# Patient Record
Sex: Male | Born: 1980 | Race: White | Hispanic: No | Marital: Married | Smoking: Never smoker
Health system: Southern US, Community
[De-identification: ages and names within clinical notes are randomized; demographics above are authoritative.]

## PROBLEM LIST (undated history)

## (undated) DIAGNOSIS — K5909 Other constipation: Secondary | ICD-10-CM

## (undated) DIAGNOSIS — D497 Neoplasm of unspecified behavior of endocrine glands and other parts of nervous system: Secondary | ICD-10-CM

## (undated) DIAGNOSIS — Z789 Other specified health status: Secondary | ICD-10-CM

## (undated) DIAGNOSIS — K5900 Constipation, unspecified: Secondary | ICD-10-CM

## (undated) DIAGNOSIS — K219 Gastro-esophageal reflux disease without esophagitis: Secondary | ICD-10-CM

## (undated) DIAGNOSIS — Z86018 Personal history of other benign neoplasm: Secondary | ICD-10-CM

## (undated) DIAGNOSIS — Z87898 Personal history of other specified conditions: Secondary | ICD-10-CM

## (undated) DIAGNOSIS — R51 Headache: Secondary | ICD-10-CM

## (undated) DIAGNOSIS — N319 Neuromuscular dysfunction of bladder, unspecified: Secondary | ICD-10-CM

## (undated) HISTORY — PX: INGUINAL HERNIA REPAIR: SUR1180

## (undated) HISTORY — DX: Constipation, unspecified: K59.00

## (undated) HISTORY — PX: HERNIA REPAIR: SHX51

---

## 2012-04-06 ENCOUNTER — Ambulatory Visit (HOSPITAL_COMMUNITY)
Admission: RE | Admit: 2012-04-06 | Discharge: 2012-04-06 | Disposition: A | Discharge status: Home / Self Care | Payer: BC Managed Care – PPO | Source: Ambulatory Visit | Attending: Physical Medicine and Rehabilitation | Admitting: Physical Medicine and Rehabilitation

## 2012-04-06 ENCOUNTER — Other Ambulatory Visit (HOSPITAL_COMMUNITY): Payer: Self-pay | Admitting: Physical Medicine and Rehabilitation

## 2012-04-06 ENCOUNTER — Other Ambulatory Visit: Payer: Self-pay | Admitting: Physical Medicine and Rehabilitation

## 2012-04-06 ENCOUNTER — Ambulatory Visit
Admission: RE | Admit: 2012-04-06 | Discharge: 2012-04-06 | Disposition: A | Discharge status: Home / Self Care | Payer: BC Managed Care – PPO | Source: Ambulatory Visit | Attending: Physical Medicine and Rehabilitation | Admitting: Physical Medicine and Rehabilitation

## 2012-04-06 ENCOUNTER — Inpatient Hospital Stay
Admission: RE | Admit: 2012-04-06 | Discharge: 2012-04-06 | Disposition: A | Discharge status: Home / Self Care | Payer: Self-pay | Source: Ambulatory Visit | Attending: Physical Medicine and Rehabilitation | Admitting: Physical Medicine and Rehabilitation

## 2012-04-06 VITALS — BP 120/78 | HR 64 | Ht 74.0 in | Wt 230.0 lb

## 2012-04-06 DIAGNOSIS — M549 Dorsalgia, unspecified: Secondary | ICD-10-CM

## 2012-04-06 DIAGNOSIS — T7589XD Other specified effects of external causes, subsequent encounter: Secondary | ICD-10-CM

## 2012-04-06 DIAGNOSIS — R209 Unspecified disturbances of skin sensation: Secondary | ICD-10-CM | POA: Insufficient documentation

## 2012-04-06 DIAGNOSIS — R29898 Other symptoms and signs involving the musculoskeletal system: Secondary | ICD-10-CM | POA: Insufficient documentation

## 2012-04-06 DIAGNOSIS — G9389 Other specified disorders of brain: Secondary | ICD-10-CM

## 2012-04-06 DIAGNOSIS — Z1389 Encounter for screening for other disorder: Secondary | ICD-10-CM | POA: Insufficient documentation

## 2012-04-06 DIAGNOSIS — R269 Unspecified abnormalities of gait and mobility: Secondary | ICD-10-CM | POA: Insufficient documentation

## 2012-04-06 DIAGNOSIS — J3489 Other specified disorders of nose and nasal sinuses: Secondary | ICD-10-CM | POA: Insufficient documentation

## 2012-04-06 DIAGNOSIS — M545 Low back pain, unspecified: Secondary | ICD-10-CM | POA: Insufficient documentation

## 2012-04-06 DIAGNOSIS — G939 Disorder of brain, unspecified: Secondary | ICD-10-CM | POA: Insufficient documentation

## 2012-04-06 MED ORDER — DIAZEPAM 5 MG PO TABS
10.0000 mg | ORAL_TABLET | Freq: Once | ORAL | Status: AC
  Administered 2012-04-06: 10 mg via ORAL

## 2012-04-06 MED ORDER — GADOBENATE DIMEGLUMINE 529 MG/ML IV SOLN
20.0000 mL | Freq: Once | INTRAVENOUS | Status: AC | PRN
  Administered 2012-04-06: 20 mL via INTRAVENOUS

## 2012-04-06 MED ORDER — IOHEXOL 180 MG/ML  SOLN
15.0000 mL | Freq: Once | INTRAMUSCULAR | Status: AC | PRN
  Administered 2012-04-06: 15 mL via INTRATHECAL

## 2012-04-06 MED ORDER — DIAZEPAM 5 MG PO TABS: 10.0000 mg | ORAL_TABLET | Freq: Once | ORAL | Status: DC

## 2012-04-06 NOTE — Progress Notes (Signed)
Dr. Alfredo Batty in to talk to pt and wife multiple times due to finding on myelo.  Mother in law and sister in law arrived and Dr. Alfredo Batty explained the findings to them. Plain films done of orbits and pelvis and MRI of head,cervical, thoracic and lumbar are being scheduled in the near future. Dr. Alfredo Batty explained at length why this was necessary and the family asked questions and received answers.

## 2012-04-06 NOTE — Progress Notes (Signed)
Procedure and discharge instructions explained to patient and his wife.  Questions answered.  jkl

## 2012-04-08 ENCOUNTER — Ambulatory Visit (HOSPITAL_COMMUNITY)
Admission: RE | Admit: 2012-04-08 | Discharge: 2012-04-08 | Disposition: A | Discharge status: Home / Self Care | Payer: BC Managed Care – PPO | Source: Ambulatory Visit | Attending: Physical Medicine and Rehabilitation | Admitting: Physical Medicine and Rehabilitation

## 2012-04-08 ENCOUNTER — Ambulatory Visit (HOSPITAL_COMMUNITY): Payer: BC Managed Care – PPO

## 2012-04-08 DIAGNOSIS — D492 Neoplasm of unspecified behavior of bone, soft tissue, and skin: Secondary | ICD-10-CM | POA: Insufficient documentation

## 2012-04-08 DIAGNOSIS — IMO0002 Reserved for concepts with insufficient information to code with codable children: Secondary | ICD-10-CM | POA: Insufficient documentation

## 2012-04-08 DIAGNOSIS — M899 Disorder of bone, unspecified: Secondary | ICD-10-CM | POA: Insufficient documentation

## 2012-04-08 DIAGNOSIS — G9389 Other specified disorders of brain: Secondary | ICD-10-CM

## 2012-04-08 DIAGNOSIS — M48061 Spinal stenosis, lumbar region without neurogenic claudication: Secondary | ICD-10-CM | POA: Insufficient documentation

## 2012-04-08 DIAGNOSIS — M5144 Schmorl's nodes, thoracic region: Secondary | ICD-10-CM | POA: Insufficient documentation

## 2012-04-08 DIAGNOSIS — M549 Dorsalgia, unspecified: Secondary | ICD-10-CM | POA: Insufficient documentation

## 2012-04-08 MED ORDER — GADOBENATE DIMEGLUMINE 529 MG/ML IV SOLN
20.0000 mL | Freq: Once | INTRAVENOUS | Status: AC | PRN
  Administered 2012-04-08: 20 mL via INTRAVENOUS

## 2012-04-11 ENCOUNTER — Other Ambulatory Visit: Payer: Self-pay | Admitting: Neurosurgery

## 2012-04-12 ENCOUNTER — Encounter (HOSPITAL_COMMUNITY): Payer: Self-pay | Admitting: Pharmacy Technician

## 2012-04-14 ENCOUNTER — Encounter (HOSPITAL_COMMUNITY)
Admission: RE | Admit: 2012-04-14 | Discharge: 2012-04-14 | Disposition: A | Discharge status: Home / Self Care | Payer: BC Managed Care – PPO | Source: Ambulatory Visit | Attending: Neurosurgery | Admitting: Neurosurgery

## 2012-04-14 ENCOUNTER — Encounter (HOSPITAL_COMMUNITY): Payer: Self-pay

## 2012-04-14 DIAGNOSIS — Z01812 Encounter for preprocedural laboratory examination: Secondary | ICD-10-CM | POA: Insufficient documentation

## 2012-04-14 DIAGNOSIS — D492 Neoplasm of unspecified behavior of bone, soft tissue, and skin: Secondary | ICD-10-CM | POA: Insufficient documentation

## 2012-04-14 HISTORY — DX: Headache: R51

## 2012-04-14 LAB — BASIC METABOLIC PANEL
BUN: 9 mg/dL (ref 6–23)
CO2: 29 mEq/L (ref 19–32)
Calcium: 9.8 mg/dL (ref 8.4–10.5)
Chloride: 100 mEq/L (ref 96–112)
Creatinine, Ser: 0.91 mg/dL (ref 0.50–1.35)
GFR calc Af Amer: >90 mL/min (ref >90)
GFR calc non Af Amer: >90 mL/min (ref >90)
Glucose, Bld: 79 mg/dL (ref 70–99)
Potassium: 4.6 mEq/L (ref 3.5–5.1)
Sodium: 138 mEq/L (ref 135–145)

## 2012-04-14 LAB — CBC
HCT: 43.4 % (ref 39.0–52.0)
Hemoglobin: 15.6 g/dL (ref 13.0–17.0)
MCH: 30.8 pg (ref 26.0–34.0)
MCHC: 35.9 g/dL (ref 30.0–36.0)
MCV: 85.6 fL (ref 78.0–100.0)
Platelets: 282 10*3/uL (ref 150–400)
RBC: 5.07 MIL/uL (ref 4.22–5.81)
RDW: 12.5 % (ref 11.5–15.5)
WBC: 9.1 10*3/uL (ref 4.0–10.5)

## 2012-04-14 LAB — TYPE AND SCREEN
ABO/RH(D): O NEG
Antibody Screen: NEGATIVE

## 2012-04-14 LAB — ABO/RH: ABO/RH(D): O NEG

## 2012-04-14 LAB — SURGICAL PCR SCREEN
MRSA, PCR: NEGATIVE
Staphylococcus aureus: NEGATIVE

## 2012-04-14 NOTE — Pre-Procedure Instructions (Signed)
GOTTLIEB ZUERCHER  04/14/2012   Your procedure is scheduled on:  Wednesday April 116, 2014  Report to [facility] at 0830 AM.  Call this number if you have problems the morning of surgery: [phone]   Remember:   Do not eat food or drink liquids after midnight.Tuesday   Take these medicines the morning of surgery with A SIP OF WATER: None   Do not wear jewelry.  Do not wear lotions, or powders. You may wear deodorant.             Men may shave face and neck.  Do not bring valuables to the hospital.  Contacts, dentures or bridgework may not be worn into surgery.  Leave suitcase in the car. After surgery it may be brought to your room.  For patients admitted to the hospital, checkout time is 11:00 AM the day of  discharge.   Patients discharged the day of surgery will not be allowed to drive  home.    Special Instructions: Shower using CHG 2 nights before surgery and the night before surgery.  If you shower the day of surgery use CHG.  Use special wash - you have one bottle of CHG for all showers.  You should use approximately 1/3 of the bottle for each shower.   Please read over the following fact sheets that you were given: Pain Booklet, Coughing and Deep Breathing, Blood Transfusion Information, MRSA Information and Surgical Site Infection Prevention

## 2012-04-18 MED ORDER — CEFAZOLIN SODIUM-DEXTROSE 2-3 GM-% IV SOLR
2.0000 g | INTRAVENOUS | Status: AC
  Administered 2012-04-19: 2 g via INTRAVENOUS
  Filled 2012-04-18: qty 50

## 2012-04-19 ENCOUNTER — Encounter (HOSPITAL_COMMUNITY)
Admission: RE | Disposition: A | Discharge status: Home / Self Care | Payer: Self-pay | Source: Ambulatory Visit | Attending: Neurosurgery

## 2012-04-19 ENCOUNTER — Observation Stay (HOSPITAL_COMMUNITY)
Admission: RE | Admit: 2012-04-19 | Discharge: 2012-04-21 | Disposition: A | Discharge status: Home / Self Care | Payer: BC Managed Care – PPO | Source: Ambulatory Visit | Attending: Neurosurgery | Admitting: Neurosurgery

## 2012-04-19 ENCOUNTER — Inpatient Hospital Stay (HOSPITAL_COMMUNITY): Payer: BC Managed Care – PPO

## 2012-04-19 ENCOUNTER — Inpatient Hospital Stay (HOSPITAL_COMMUNITY): Payer: BC Managed Care – PPO | Admitting: Anesthesiology

## 2012-04-19 ENCOUNTER — Encounter (HOSPITAL_COMMUNITY): Payer: Self-pay | Admitting: Anesthesiology

## 2012-04-19 DIAGNOSIS — R350 Frequency of micturition: Secondary | ICD-10-CM | POA: Insufficient documentation

## 2012-04-19 DIAGNOSIS — D334 Benign neoplasm of spinal cord: Principal | ICD-10-CM | POA: Insufficient documentation

## 2012-04-19 HISTORY — PX: LAMINECTOMY: SHX219

## 2012-04-19 HISTORY — DX: Neoplasm of unspecified behavior of endocrine glands and other parts of nervous system: D49.7

## 2012-04-19 SURGERY — THORACIC LAMINECTOMY FOR TUMOR
Anesthesia: General | Site: Back | Wound class: Clean

## 2012-04-19 MED ORDER — SODIUM CHLORIDE 0.9 % IR SOLN
Status: DC | PRN
  Administered 2012-04-19: 10:00:00

## 2012-04-19 MED ORDER — DEXAMETHASONE SODIUM PHOSPHATE 10 MG/ML IJ SOLN
INTRAMUSCULAR | Status: DC | PRN
  Administered 2012-04-19: 10 mg via INTRAVENOUS

## 2012-04-19 MED ORDER — OXYCODONE HCL 5 MG PO TABS
5.0000 mg | ORAL_TABLET | ORAL | Status: DC | PRN
  Administered 2012-04-19 – 2012-04-21 (×8): 10 mg via ORAL
  Filled 2012-04-19 (×8): qty 2

## 2012-04-19 MED ORDER — NEOSTIGMINE METHYLSULFATE 1 MG/ML IJ SOLN
INTRAMUSCULAR | Status: DC | PRN
  Administered 2012-04-19: 3 mg via INTRAVENOUS

## 2012-04-19 MED ORDER — ARTIFICIAL TEARS OP OINT
TOPICAL_OINTMENT | OPHTHALMIC | Status: DC | PRN
  Administered 2012-04-19: 1 via OPHTHALMIC

## 2012-04-19 MED ORDER — DEXTROSE-NACL 5-0.45 % IV SOLN
INTRAVENOUS | Status: DC
  Administered 2012-04-20: 04:00:00 via INTRAVENOUS

## 2012-04-19 MED ORDER — MORPHINE SULFATE 4 MG/ML IJ SOLN: 4.0000 mg | INTRAMUSCULAR | Status: DC | PRN

## 2012-04-19 MED ORDER — PROMETHAZINE HCL 25 MG/ML IJ SOLN: 6.2500 mg | INTRAMUSCULAR | Status: DC | PRN

## 2012-04-19 MED ORDER — FENTANYL CITRATE 0.05 MG/ML IJ SOLN
INTRAMUSCULAR | Status: DC | PRN
  Administered 2012-04-19 (×3): 100 ug via INTRAVENOUS
  Administered 2012-04-19: 50 ug via INTRAVENOUS
  Administered 2012-04-19: 100 ug via INTRAVENOUS
  Administered 2012-04-19: 50 ug via INTRAVENOUS

## 2012-04-19 MED ORDER — KETOROLAC TROMETHAMINE 30 MG/ML IJ SOLN
30.0000 mg | Freq: Once | INTRAMUSCULAR | Status: AC
  Administered 2012-04-19: 30 mg via INTRAVENOUS

## 2012-04-19 MED ORDER — LIDOCAINE HCL (CARDIAC) 20 MG/ML IV SOLN
INTRAVENOUS | Status: DC | PRN
  Administered 2012-04-19: 100 mg via INTRAVENOUS

## 2012-04-19 MED ORDER — MENTHOL 3 MG MT LOZG: 1.0000 | LOZENGE | OROMUCOSAL | Status: DC | PRN

## 2012-04-19 MED ORDER — ACETAMINOPHEN 10 MG/ML IV SOLN
1000.0000 mg | Freq: Four times a day (QID) | INTRAVENOUS | Status: AC
  Administered 2012-04-19 – 2012-04-20 (×4): 1000 mg via INTRAVENOUS
  Filled 2012-04-19 (×5): qty 100

## 2012-04-19 MED ORDER — ONDANSETRON HCL 4 MG/2ML IJ SOLN
INTRAMUSCULAR | Status: DC | PRN
  Administered 2012-04-19: 4 mg via INTRAVENOUS

## 2012-04-19 MED ORDER — ACETAMINOPHEN 10 MG/ML IV SOLN
INTRAVENOUS | Status: DC | PRN
  Administered 2012-04-19: 1000 mg via INTRAVENOUS

## 2012-04-19 MED ORDER — SODIUM CHLORIDE 0.9 % IJ SOLN: 3.0000 mL | INTRAMUSCULAR | Status: DC | PRN

## 2012-04-19 MED ORDER — SODIUM CHLORIDE 0.9 % IJ SOLN
3.0000 mL | Freq: Two times a day (BID) | INTRAMUSCULAR | Status: DC
  Administered 2012-04-19 – 2012-04-20 (×3): 3 mL via INTRAVENOUS

## 2012-04-19 MED ORDER — ACETAMINOPHEN 650 MG RE SUPP: 650.0000 mg | RECTAL | Status: DC | PRN

## 2012-04-19 MED ORDER — THROMBIN 5000 UNITS EX SOLR
OROMUCOSAL | Status: DC | PRN
  Administered 2012-04-19: 12:00:00 via TOPICAL

## 2012-04-19 MED ORDER — HYDROMORPHONE HCL PF 1 MG/ML IJ SOLN
0.2500 mg | INTRAMUSCULAR | Status: DC | PRN
  Administered 2012-04-19 (×2): 0.5 mg via INTRAVENOUS

## 2012-04-19 MED ORDER — KETOROLAC TROMETHAMINE 30 MG/ML IJ SOLN
30.0000 mg | Freq: Four times a day (QID) | INTRAMUSCULAR | Status: DC
  Administered 2012-04-20 – 2012-04-21 (×6): 30 mg via INTRAVENOUS
  Filled 2012-04-19 (×9): qty 1

## 2012-04-19 MED ORDER — ACETAMINOPHEN 325 MG PO TABS: 650.0000 mg | ORAL_TABLET | ORAL | Status: DC | PRN

## 2012-04-19 MED ORDER — MIDAZOLAM HCL 5 MG/5ML IJ SOLN
INTRAMUSCULAR | Status: DC | PRN
  Administered 2012-04-19: 2 mg via INTRAVENOUS

## 2012-04-19 MED ORDER — MAGNESIUM HYDROXIDE 400 MG/5ML PO SUSP
30.0000 mL | Freq: Every day | ORAL | Status: DC | PRN
  Administered 2012-04-21: 30 mL via ORAL
  Filled 2012-04-19: qty 30

## 2012-04-19 MED ORDER — ROCURONIUM BROMIDE 100 MG/10ML IV SOLN
INTRAVENOUS | Status: DC | PRN
  Administered 2012-04-19: 10 mg via INTRAVENOUS
  Administered 2012-04-19: 50 mg via INTRAVENOUS
  Administered 2012-04-19: 30 mg via INTRAVENOUS
  Administered 2012-04-19: 10 mg via INTRAVENOUS

## 2012-04-19 MED ORDER — SODIUM CHLORIDE 0.9 % IV SOLN: 250.0000 mL | INTRAVENOUS | Status: DC

## 2012-04-19 MED ORDER — BISACODYL 10 MG RE SUPP: 10.0000 mg | Freq: Every day | RECTAL | Status: DC | PRN

## 2012-04-19 MED ORDER — LACTATED RINGERS IV SOLN
INTRAVENOUS | Status: DC | PRN
  Administered 2012-04-19 (×3): via INTRAVENOUS

## 2012-04-19 MED ORDER — CYCLOBENZAPRINE HCL 10 MG PO TABS
10.0000 mg | ORAL_TABLET | Freq: Three times a day (TID) | ORAL | Status: DC | PRN
  Administered 2012-04-19 – 2012-04-21 (×4): 10 mg via ORAL
  Filled 2012-04-19 (×5): qty 1

## 2012-04-19 MED ORDER — BUPIVACAINE HCL (PF) 0.25 % IJ SOLN
INTRAMUSCULAR | Status: DC | PRN
  Administered 2012-04-19: 5 mL

## 2012-04-19 MED ORDER — OXYCODONE HCL 5 MG/5ML PO SOLN: 5.0000 mg | Freq: Once | ORAL | Status: AC | PRN

## 2012-04-19 MED ORDER — LIDOCAINE-EPINEPHRINE 1 %-1:100000 IJ SOLN
INTRAMUSCULAR | Status: DC | PRN
  Administered 2012-04-19: 5 mL

## 2012-04-19 MED ORDER — GLYCOPYRROLATE 0.2 MG/ML IJ SOLN
INTRAMUSCULAR | Status: DC | PRN
  Administered 2012-04-19: 0.4 mg via INTRAVENOUS

## 2012-04-19 MED ORDER — OXYCODONE HCL 5 MG PO TABS
5.0000 mg | ORAL_TABLET | Freq: Once | ORAL | Status: AC | PRN
  Administered 2012-04-19: 5 mg via ORAL

## 2012-04-19 MED ORDER — PHENOL 1.4 % MT LIQD: 1.0000 | OROMUCOSAL | Status: DC | PRN

## 2012-04-19 MED ORDER — ZOLPIDEM TARTRATE 5 MG PO TABS: 5.0000 mg | ORAL_TABLET | Freq: Every evening | ORAL | Status: DC | PRN

## 2012-04-19 MED ORDER — 0.9 % SODIUM CHLORIDE (POUR BTL) OPTIME
TOPICAL | Status: DC | PRN
  Administered 2012-04-19 (×3): 1000 mL

## 2012-04-19 MED ORDER — ONDANSETRON HCL 4 MG/2ML IJ SOLN: 4.0000 mg | Freq: Four times a day (QID) | INTRAMUSCULAR | Status: DC | PRN

## 2012-04-19 MED ORDER — HYDROXYZINE HCL 25 MG PO TABS: 50.0000 mg | ORAL_TABLET | ORAL | Status: DC | PRN

## 2012-04-19 MED ORDER — ALUM & MAG HYDROXIDE-SIMETH 200-200-20 MG/5ML PO SUSP
30.0000 mL | Freq: Four times a day (QID) | ORAL | Status: DC | PRN
  Administered 2012-04-20 – 2012-04-21 (×2): 30 mL via ORAL
  Filled 2012-04-19 (×2): qty 30

## 2012-04-19 MED ORDER — PROPOFOL 10 MG/ML IV BOLUS
INTRAVENOUS | Status: DC | PRN
  Administered 2012-04-19: 200 mg via INTRAVENOUS

## 2012-04-19 MED ORDER — THROMBIN 20000 UNITS EX SOLR
CUTANEOUS | Status: DC | PRN
  Administered 2012-04-19: 10:00:00 via TOPICAL

## 2012-04-19 SURGICAL SUPPLY — 73 items
BAG DECANTER FOR FLEXI CONT (MISCELLANEOUS) ×2 IMPLANT
BENZOIN TINCTURE PRP APPL 2/3 (GAUZE/BANDAGES/DRESSINGS) IMPLANT
BLADE SURG 11 STRL SS (BLADE) ×2 IMPLANT
BLADE ULTRA TIP 2M (BLADE) IMPLANT
BRUSH SCRUB EZ 1% IODOPHOR (MISCELLANEOUS) IMPLANT
BUR ACRON 5.0MM COATED (BURR) ×2 IMPLANT
BUR MATCHSTICK NEURO 3.0 LAGG (BURR) ×2 IMPLANT
CANISTER SUCTION 2500CC (MISCELLANEOUS) ×2 IMPLANT
CLIP TI MEDIUM 6 (CLIP) IMPLANT
CLOTH BEACON ORANGE TIMEOUT ST (SAFETY) ×2 IMPLANT
CONT SPEC 4OZ CLIKSEAL STRL BL (MISCELLANEOUS) ×2 IMPLANT
COVER MAYO STAND STRL (DRAPES) IMPLANT
DERMABOND ADHESIVE PROPEN (GAUZE/BANDAGES/DRESSINGS) ×3
DERMABOND ADVANCED .7 DNX6 (GAUZE/BANDAGES/DRESSINGS) ×3 IMPLANT
DRAPE LAPAROTOMY 100X72 PEDS (DRAPES) IMPLANT
DRAPE LAPAROTOMY 100X72X124 (DRAPES) ×2 IMPLANT
DRAPE MICROSCOPE LEICA (MISCELLANEOUS) ×2 IMPLANT
DRAPE POUCH INSTRU U-SHP 10X18 (DRAPES) ×2 IMPLANT
DRAPE PROXIMA HALF (DRAPES) ×2 IMPLANT
DRSG EMULSION OIL 3X3 NADH (GAUZE/BANDAGES/DRESSINGS) IMPLANT
DURASEAL APPLICATOR TIP (TIP) ×2 IMPLANT
DURASEAL SPINE SEALANT 3ML (MISCELLANEOUS) ×2 IMPLANT
ELECT REM PT RETURN 9FT ADLT (ELECTROSURGICAL) ×2
ELECTRODE REM PT RTRN 9FT ADLT (ELECTROSURGICAL) ×1 IMPLANT
GAUZE SPONGE 4X4 16PLY XRAY LF (GAUZE/BANDAGES/DRESSINGS) ×2 IMPLANT
GLOVE BIOGEL M 8.0 STRL (GLOVE) ×2 IMPLANT
GLOVE BIOGEL PI IND STRL 7.0 (GLOVE) ×1 IMPLANT
GLOVE BIOGEL PI IND STRL 7.5 (GLOVE) ×2 IMPLANT
GLOVE BIOGEL PI IND STRL 8 (GLOVE) ×2 IMPLANT
GLOVE BIOGEL PI INDICATOR 7.0 (GLOVE) ×1
GLOVE BIOGEL PI INDICATOR 7.5 (GLOVE) ×2
GLOVE BIOGEL PI INDICATOR 8 (GLOVE) ×2
GLOVE ECLIPSE 7.5 STRL STRAW (GLOVE) ×2 IMPLANT
GLOVE EXAM NITRILE LRG STRL (GLOVE) IMPLANT
GLOVE EXAM NITRILE MD LF STRL (GLOVE) ×2 IMPLANT
GLOVE EXAM NITRILE XL STR (GLOVE) IMPLANT
GLOVE EXAM NITRILE XS STR PU (GLOVE) IMPLANT
GLOVE SURG SS PI 7.0 STRL IVOR (GLOVE) ×8 IMPLANT
GOWN BRE IMP SLV AUR LG STRL (GOWN DISPOSABLE) IMPLANT
GOWN BRE IMP SLV AUR XL STRL (GOWN DISPOSABLE) ×10 IMPLANT
GOWN STRL REIN 2XL LVL4 (GOWN DISPOSABLE) IMPLANT
HEMOSTAT SURGICEL 2X14 (HEMOSTASIS) IMPLANT
KIT BASIN OR (CUSTOM PROCEDURE TRAY) ×2 IMPLANT
KIT ROOM TURNOVER OR (KITS) ×2 IMPLANT
NEEDLE SPNL 18GX3.5 QUINCKE PK (NEEDLE) ×4 IMPLANT
NEEDLE SPNL 22GX3.5 QUINCKE BK (NEEDLE) ×2 IMPLANT
NS IRRIG 1000ML POUR BTL (IV SOLUTION) ×2 IMPLANT
PACK LAMINECTOMY NEURO (CUSTOM PROCEDURE TRAY) ×2 IMPLANT
PAD ARMBOARD 7.5X6 YLW CONV (MISCELLANEOUS) ×6 IMPLANT
PATTIES SURGICAL .25X.25 (GAUZE/BANDAGES/DRESSINGS) IMPLANT
PATTIES SURGICAL .5 X.5 (GAUZE/BANDAGES/DRESSINGS) ×2 IMPLANT
PATTIES SURGICAL .5 X3 (DISPOSABLE) ×2 IMPLANT
PATTIES SURGICAL 1/4 X 3 (GAUZE/BANDAGES/DRESSINGS) ×2 IMPLANT
RUBBERBAND STERILE (MISCELLANEOUS) ×4 IMPLANT
SPECIMEN JAR SMALL (MISCELLANEOUS) IMPLANT
SPONGE GAUZE 4X4 12PLY (GAUZE/BANDAGES/DRESSINGS) ×4 IMPLANT
SPONGE LAP 4X18 X RAY DECT (DISPOSABLE) IMPLANT
SPONGE NEURO XRAY DETECT 1X3 (DISPOSABLE) ×2 IMPLANT
SPONGE SURGIFOAM ABS GEL 100 (HEMOSTASIS) ×2 IMPLANT
STRIP CLOSURE SKIN 1/4X4 (GAUZE/BANDAGES/DRESSINGS) IMPLANT
SUT PROLENE 6 0 BV (SUTURE) ×16 IMPLANT
SUT VIC AB 0 CT1 18XCR BRD8 (SUTURE) ×2 IMPLANT
SUT VIC AB 0 CT1 8-18 (SUTURE) ×2
SUT VIC AB 2-0 CP2 18 (SUTURE) ×4 IMPLANT
SUT VIC AB 3-0 SH 8-18 (SUTURE) ×2 IMPLANT
SYR 20ML ECCENTRIC (SYRINGE) ×2 IMPLANT
TAPE CLOTH SURG 4X10 WHT LF (GAUZE/BANDAGES/DRESSINGS) ×2 IMPLANT
TIP SONASTAR STD MISONIX 1.9 (TRAY / TRAY PROCEDURE) IMPLANT
TOWEL OR 17X24 6PK STRL BLUE (TOWEL DISPOSABLE) ×2 IMPLANT
TOWEL OR 17X26 10 PK STRL BLUE (TOWEL DISPOSABLE) ×2 IMPLANT
TRAY FOLEY CATH 14FRSI W/METER (CATHETERS) IMPLANT
TRAY FOLEY CATH 16FRSI W/METER (SET/KITS/TRAYS/PACK) ×2 IMPLANT
WATER STERILE IRR 1000ML POUR (IV SOLUTION) ×2 IMPLANT

## 2012-04-19 NOTE — Anesthesia Postprocedure Evaluation (Signed)
Anesthesia Post Note  Patient: Jim Simmons  Procedure(s) Performed: Procedure(s) (LRB): ThoracoLumbar Laminectomy with resection of spinal tumor (N/A)  Anesthesia type: general  Patient location: PACU  Post pain: Pain level controlled  Post assessment: Patient's Cardiovascular Status Stable  Last Vitals:  Filed Vitals:   04/19/12 1326  BP: 145/90  Pulse: 72  Temp: 36.1 C  Resp: 15    Post vital signs: Reviewed and stable  Level of consciousness: sedated  Complications: No apparent anesthesia complications

## 2012-04-19 NOTE — Progress Notes (Signed)
Per patient request he wanted to be made a confidential patient. Pt reports stepping on a 1/4 inch piece of wire on Monday wound unremarkable. Also reports getting bit by 2 ticks on left medial upper arm on Sunday. Sites unremarkable

## 2012-04-19 NOTE — Progress Notes (Signed)
Filed Vitals:   04/19/12 1415 04/19/12 1429 04/19/12 1430 04/19/12 1614  BP: 129/78 123/73  110/74  Pulse: 65 60 58 80  Temp:   96.8 F (36 C)   TempSrc:      Resp: 10 16 13 18   SpO2:  99%  93%    Patient resting in bed comfortably. Head of bed is flat, he is being log rolled side to side. Dressing clean and dry. Moving all extremities well. Foley to straight drainage.  Plan: We'll continue bedrest, and reevaluate in a.m.  Hewitt Shorts, MD 04/19/2012, 4:43 PM

## 2012-04-19 NOTE — Anesthesia Preprocedure Evaluation (Addendum)
Anesthesia Evaluation  Patient identified by MRN, date of birth, ID band Patient awake    Reviewed: Allergy & Precautions, H&P , NPO status , Patient's Chart, lab work & pertinent test results  History of Anesthesia Complications Negative for: history of anesthetic complications  Airway Mallampati: II TM Distance: >3 FB Neck ROM: Full    Dental  (+) Teeth Intact and Dental Advisory Given   Pulmonary neg pulmonary ROS,    Pulmonary exam normal       Cardiovascular negative cardio ROS      Neuro/Psych negative psych ROS   GI/Hepatic negative GI ROS, Neg liver ROS,   Endo/Other  negative endocrine ROS  Renal/GU negative Renal ROS     Musculoskeletal   Abdominal   Peds  Hematology negative hematology ROS (+)   Anesthesia Other Findings   Reproductive/Obstetrics                          Anesthesia Physical Anesthesia Plan  ASA: II  Anesthesia Plan: General   Post-op Pain Management:    Induction: Intravenous  Airway Management Planned: Oral ETT  Additional Equipment:   Intra-op Plan:   Post-operative Plan: Extubation in OR  Informed Consent: I have reviewed the patients History and Physical, chart, labs and discussed the procedure including the risks, benefits and alternatives for the proposed anesthesia with the patient or authorized representative who has indicated his/her understanding and acceptance.   Dental advisory given  Plan Discussed with: CRNA, Anesthesiologist and Surgeon  Anesthesia Plan Comments:        Anesthesia Quick Evaluation

## 2012-04-19 NOTE — Plan of Care (Signed)
Problem: Consults Goal: Diagnosis - Spinal Surgery Outcome: Completed/Met Date Met:  04/19/12 Thoracolumbar Laminectomy with resection of spinal cord

## 2012-04-19 NOTE — Transfer of Care (Signed)
Immediate Anesthesia Transfer of Care Note  Patient: Jim Simmons  Procedure(s) Performed: Procedure(s) with comments: ThoracoLumbar Laminectomy with resection of spinal tumor (N/A) - ThoracoLumbar Laminectomy with resection of spinal tumor  Patient Location: PACU  Anesthesia Type:General  Level of Consciousness: awake, alert , oriented and patient cooperative  Airway & Oxygen Therapy: Patient Spontanous Breathing and Patient connected to nasal cannula oxygen  Post-op Assessment: Report given to PACU RN, Post -op Vital signs reviewed and stable and Patient moving all extremities  Post vital signs: Reviewed and stable  Complications: No apparent anesthesia complications

## 2012-04-19 NOTE — Op Note (Signed)
04/19/2012  1:32 PM  PATIENT:  Jim Simmons  32 y.o. male  PRE-OPERATIVE DIAGNOSIS:  Cauda equina tumor  POST-OPERATIVE DIAGNOSIS:  Cauda equina tumor   PROCEDURE:  Procedure(s): ThoracoLumbar Laminectomy with resection of spinal tumor: T12-L2 thoracolumbar laminectomy , with intradural exploration and gross total resection of cauda equina tumor (intradural extramedullary) with microdissection, microsurgical technique, and the operating microscope.  SURGEON:  Surgeon(s): Hewitt Shorts, MD Tia Alert, MD  ASSISTANTS: Marikay Alar, M.D.  ANESTHESIA:   general  EBL:  Total I/O In: 2000 [I.V.:2000] Out: 625 [Urine:425; Blood:200]  BLOOD ADMINISTERED:none  COUNT: Correct per nursing staff  DICTATION: Patient was brought to the operating room, placed under general endotracheal anesthesia. Patient was turned to a prone position.The thoracolumbar region was prepped with Betadine soap and solution draped in a sterile fashion. An x-ray was taken and we localized the T12-L2 level. The midline was infiltrated with local site with epinephrine, and a midline incision made carried down to subcutaneous tissue. Bipolar cautery and electrocautery used to maintain hemostasis. Dissection was carried down to the thoracolumbar fascia which was incised bilaterally and the paraspinal musculature was dissected from the spinous process lamina in a subperiosteal fashion. Another x-ray was taken for localization and we identified the T12, L1, and L2 spinous process lamina. Laminectomies begun with Leksell rongeurs, and continued with the high-speed drill and Kerrison punches with thin footplates. The operating microscope was draped and brought in the field to provide additional magnification, illumination, and visualization, and the remainder of the decompression and tumor resection was performed using microdissection and microsurgical technique. Thin pledgets of Gelfoam with thrombin were placed in the  lateral epidural space and cottonoids placed over them. We then open the dura beginning caudally and using a [facility], the dura was opened rostrally taking care to avoid any of the underlying intradural structures. The tumor was immediately identified and we had good exposure to both the filum terminale rostral and caudal to the tumor. We identified the fillum terminale entering into the rostral end of the tumor. The filum and a blood vessel accompanying it into the tumor were coagulated and sharply divided. We then began to mobilize the tumor, freeing up thin arachnoid adhesions which were sharply divided. The tumor was fully mobilized, and we identified the filum exiting the caudal end of the tumor. It was coagulated and sharply divided. The tumor was removed en bloc and sent as specimen in formalin for permanent pathologic examination.  The intradural space was gently irrigated with saline until clear. We then proceeded with dural closure, with a running 6-0 Prolene suture. We valsalva'd the patient, and no CSF leakage was seen. We then injected DuraSeal over the dural closure, and injected Surgifoam around the margins of the laminectomy defect. Good hemostasis was established. We then proceeded with closure. Paraspinal muscles were approximate interrupted undyed 1 Vicryl sutures. Deep fascia closed with interrupted undyed 1 Vicryl sutures. Scarpa's fascia closed with interrupted inverted undyed 1 and 2-0 undyed Vicryl suture. In the subcutaneous and subcuticular closed with interrupted inverted 2-0 and 3-0 undyed Vicryl suture. Skin edges were approximated with Dermabond. The wound was dressed with sterile gauze and Hypafix. Following surgery the patient was turned back to a supine position, reversed from the anesthetic, extubated, and transferred to recovery room for further care, where he was noted to be moving all 4 extremities to command.  PLAN OF CARE: Admit for overnight observation  PATIENT  DISPOSITION:  PACU - hemodynamically stable.  Delay start of Pharmacological VTE agent (>24hrs) due to surgical blood loss or risk of bleeding:  yes

## 2012-04-19 NOTE — H&P (Signed)
Subjective: Patient is a 32 y.o. 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 Patient has been having low back pain for several years, which has worsened particularly over the past several months. He describes pain from the low back radiating down into the buttocks, thighs, and legs bilaterally.  He does describe urinary frequency and difficulty with emptying his bladder. He's undergone extensive nonsurgical management other physicians. Workup over the past month included myelogram and post Monogram CT scan, and subsequent MRI scans of the brain, cervical spine, and lumbar spine. No lesions other than the intradural mass at the L1 and L2 level were found. Patient is admitted now for a thoracolumbar laminectomy, intradural exploration, and resection of tumor.    Past Medical History  Diagnosis Date  . Headache     migraines    Past Surgical History  Procedure Laterality Date  . Hernia repair Right     inguinal    No prescriptions prior to admission   No Known Allergies  History  Substance Use Topics  . Smoking status: Never Smoker   . Smokeless tobacco: Never Used  . Alcohol Use: Yes     Comment: social    No family history on file.   Review of Systems A comprehensive review of systems was negative.  Objective: Vital signs in last 24 hours:    EXAM: Patient is a well-developed well-nourished white male in no acute distress. Lungs are clear to auscultation , the patient has symmetrical respiratory excursion. Heart has a regular rate and rhythm normal S1 and S2 no murmur.   Abdomen is soft nontender nondistended bowel sounds are present. Extremity examination shows no clubbing cyanosis or edema. Musculoskeletal examination shows no tenderness to palpation over the thoracic or lumbar spinous processes, or parathoracic or paralumbar musculature. He is able to flex to 90, and is able to extend to 10.  Straight leg raising is negative bilaterally. Motor examination shows 5 over 5 strength in the lower extremities including the iliopsoas quadriceps dorsiflexor extensor hallicus  longus and plantar flexor bilaterally. Sensation is intact to pinprick in the distal lower extremities. Reflexes are symmetrical bilaterally. No pathologic reflexes are present. Patient has a normal gait and stance.   Data Review:CBC    Component Value Date/Time   WBC 9.1 04/14/2012 1536   RBC 5.07 04/14/2012 1536   HGB 15.6 04/14/2012 1536   HCT 43.4 04/14/2012 1536   PLT 282 04/14/2012 1536   MCV 85.6 04/14/2012 1536   MCH 30.8 04/14/2012 1536   MCHC 35.9 04/14/2012 1536   RDW 12.5 04/14/2012 1536                          BMET    Component Value Date/Time   NA 138 04/14/2012 1536   K 4.6 04/14/2012 1536   CL 100 04/14/2012 1536   CO2 29 04/14/2012 1536   GLUCOSE 79 04/14/2012 1536   BUN 9 04/14/2012 1536   CREATININE 0.91 04/14/2012 1536   CALCIUM 9.8 04/14/2012 1536   GFRNONAA >90 04/14/2012 1536   GFRAA >90 04/14/2012 1536     Assessment/Plan: Patient with a history of back pain for several years, worse over the past several months. His bladder dysfunction indicates cauda equina dysfunction.  He is otherwise neurologically intact with good strength and sensation. 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 Patient is admitted now for a thoracolumbar laminectomy, intradural exploration, and resection of tumor.  I've discussed with the patient the nature of his condition, the nature the surgical procedure, the typical length of surgery, hospital stay, and overall recuperation. We discussed limitations postoperatively. I discussed risks of surgery including risks of infection, bleeding, possibly need for transfusion, the risk of nerve root dysfunction with pain, weakness, numbness, or paresthesias, risk of CSF leakage and  possible need for further surgery, the risk of bladder and/or bowel dysfunction, the risk of instability of the spine and possibly further surgery and the risk of anesthetic complications including myocardial infarction, stroke, pneumonia, and death. Understanding all this the patient does wish to proceed with surgery and is admitted for such.    Hewitt Shorts, MD 04/19/2012 7:32 AM

## 2012-04-20 ENCOUNTER — Encounter (HOSPITAL_COMMUNITY): Payer: Self-pay | Admitting: Neurosurgery

## 2012-04-20 NOTE — Progress Notes (Signed)
Filed Vitals:   04/19/12 2005 04/20/12 0000 04/20/12 0400 04/20/12 0820  BP: 123/77 114/53 111/68 106/66  Pulse: 85 75 82 76  Temp: 98.4 F (36.9 C) 98.5 F (36.9 C) 98.7 F (37.1 C) 97.5 F (36.4 C)  TempSrc:      Resp: 18 18 18 18   SpO2: 93% 94% 95% 93%    Patient resting in bed comfortably. Dressing clean and dry. Moving all extremities well. Will begin to mobilize today, gradually raising head of bed, than sitting, then standing, and ambulating. Once up and about we'll DC Foley. We'll DC dressing in a.m.   Plan: Progress through postoperative recovery.  Hewitt Shorts, MD 04/20/2012, 9:42 AM

## 2012-04-20 NOTE — Progress Notes (Signed)
UR COMPLETED  

## 2012-04-21 MED ORDER — OXYCODONE-ACETAMINOPHEN 5-325 MG PO TABS
1.0000 | ORAL_TABLET | ORAL | Status: DC | PRN
  Administered 2012-04-21: 2 via ORAL
  Filled 2012-04-21: qty 2

## 2012-04-21 MED ORDER — CYCLOBENZAPRINE HCL 10 MG PO TABS: 10.0000 mg | ORAL_TABLET | Freq: Three times a day (TID) | ORAL | Status: DC | PRN

## 2012-04-21 MED ORDER — HYDROCODONE-ACETAMINOPHEN 5-325 MG PO TABS: 1.0000 | ORAL_TABLET | ORAL | Status: DC | PRN

## 2012-04-21 MED ORDER — OXYCODONE-ACETAMINOPHEN 5-325 MG PO TABS: 1.0000 | ORAL_TABLET | ORAL | Status: DC | PRN

## 2012-04-21 NOTE — Progress Notes (Signed)
PT. UP TO BATHROOM  AND STILL UNABLE TO VOID SINCE FOLEY D/C'D. PT. ASSISTED TO AMBULATE IN HALL AND UPON RETURN TO BATHROOM STILL UNABLE TO VOID. BLADDER SCAN  SHOWED IN BLADDER. IN & OUT CATH AT 0125 WAS DONE AND OF YELLOW COLORED URINE WAS OBTAINED. PT. TOLERATED WELL. ORAL FLUID INTAKE HAS BEEN ENCOURAGED AND THE PLAN IS TO REMIND PT. DURING THE NIGHT TO DRINK WATER AND JUICE.

## 2012-04-21 NOTE — Progress Notes (Signed)
Pt and wife given D/C instructions with Rx's. Pt and wife taught how to use leg bag with return demo. Pt D/C'd home via wheelchair @ 1245 per MD order. Rema Fendt, RN

## 2012-04-21 NOTE — Discharge Summary (Signed)
Physician Discharge Summary  Patient ID: Jim Simmons MRN: [id number] DOB/AGE: [birth year] 33 y.o.  Admit date: 04/19/2012 Discharge date: 04/21/2012  Admission Diagnoses:  Cauda equina tumor  Discharge Diagnoses:  Cauda equina tumor  Discharged Condition: good  Hospital Course: Patient was admitted for a thoracolumbar laminectomy and resection of a cauda equina tumor. He presented with a history of low back pain, radiating down into the buttocks, thighs, and legs bilaterally. He also described urinary frequency and difficulty with emptying his bladder.  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 Patient underwent a T12-L2 thoracolumbar laminectomy with intradural exploration and gross total resection of his cauda equina tumor. Pathology is pending at this time. Postoperatively he has done well. He was kept on bedrest, with the head of bed flat for about 1 day. He has been reasonably comfortable. He has been able to subsequently be up and ambulating. His Foley was DC'd when he began to mobilize, however he's had continued difficulty with voiding. He's been voiding small volumes of 75-125 cc, but he has had large bladder volumes of 700-800 cc. He underwent 1 in and out catheterization, but subsequently we placed a Foley to straight drainage to a leg bag. I consulted with Dr. Su Grand by phone who recommended continued bladder rest with a Foley, and followup in his office at [facility] urology early next week. He recommended holding off on initiating any medications for treatment of the bladder dysfunction until he undergoes cystoscopy and urodynamics at his office. I've explained the patient and his wife these recommendations.   We did remove the patient's dressing, and a vastus wound open to air. His wound is healing nicely, with no  erythema, swelling, ecchymosis, or drainage. We've also given the patient instructions regarding wound care and activities following discharge. He is to return for followup with me in the office in 3-4 weeks.                                    Discharge Exam: Blood pressure 101/62, pulse 69, temperature 99 F (37.2 C), temperature source Oral, resp. rate 16, SpO2 93.00%.  Disposition: Home     Medication List    TAKE these medications       cyclobenzaprine 10 MG tablet  Commonly known as:  FLEXERIL  Take 1 tablet (10 mg total) by mouth 3 (three) times daily as needed for muscle spasms.     diclofenac 75 MG EC tablet  Commonly known as:  VOLTAREN  Take 75 mg by mouth every 12 (twelve) hours.     HYDROcodone-acetaminophen 5-325 MG per tablet  Commonly known as:  NORCO/VICODIN  Take 1-2 tablets by mouth every 4 (four) hours as needed.     oxyCODONE-acetaminophen 5-325 MG per tablet  Commonly known as:  PERCOCET/ROXICET  Take 1-2 tablets by mouth every 4 (four) hours as needed for pain.         Signed: Hewitt Shorts, MD 04/21/2012, 8:41 AM

## 2012-07-11 ENCOUNTER — Other Ambulatory Visit (HOSPITAL_COMMUNITY): Payer: Self-pay | Admitting: Neurosurgery

## 2012-07-11 DIAGNOSIS — D334 Benign neoplasm of spinal cord: Secondary | ICD-10-CM

## 2012-08-04 ENCOUNTER — Ambulatory Visit (HOSPITAL_COMMUNITY)
Admission: RE | Admit: 2012-08-04 | Discharge: 2012-08-04 | Disposition: A | Discharge status: Home / Self Care | Payer: BC Managed Care – PPO | Source: Ambulatory Visit | Attending: Neurosurgery | Admitting: Neurosurgery

## 2012-08-04 DIAGNOSIS — D334 Benign neoplasm of spinal cord: Secondary | ICD-10-CM | POA: Insufficient documentation

## 2012-08-04 MED ORDER — GADOBENATE DIMEGLUMINE 529 MG/ML IV SOLN
20.0000 mL | Freq: Once | INTRAVENOUS | Status: AC | PRN
  Administered 2012-08-04: 20 mL via INTRAVENOUS

## 2013-06-29 ENCOUNTER — Other Ambulatory Visit: Payer: Self-pay | Admitting: Physician Assistant

## 2013-12-24 ENCOUNTER — Other Ambulatory Visit: Payer: Self-pay | Admitting: Gastroenterology

## 2013-12-24 ENCOUNTER — Ambulatory Visit
Admission: RE | Admit: 2013-12-24 | Discharge: 2013-12-24 | Disposition: A | Discharge status: Home / Self Care | Payer: 59 | Source: Ambulatory Visit | Attending: Gastroenterology | Admitting: Gastroenterology

## 2013-12-24 DIAGNOSIS — R109 Unspecified abdominal pain: Secondary | ICD-10-CM

## 2013-12-24 DIAGNOSIS — K5900 Constipation, unspecified: Secondary | ICD-10-CM

## 2014-05-08 ENCOUNTER — Other Ambulatory Visit: Payer: Self-pay | Admitting: Dermatology

## 2015-05-28 ENCOUNTER — Ambulatory Visit: Payer: Self-pay | Admitting: Cardiovascular Disease

## 2015-06-10 ENCOUNTER — Encounter: Payer: Self-pay | Admitting: *Deleted

## 2015-06-13 ENCOUNTER — Encounter: Payer: Self-pay | Admitting: Cardiovascular Disease

## 2015-06-13 ENCOUNTER — Ambulatory Visit (INDEPENDENT_AMBULATORY_CARE_PROVIDER_SITE_OTHER): Payer: BLUE CROSS/BLUE SHIELD | Admitting: Cardiovascular Disease

## 2015-06-13 VITALS — BP 120/76 | HR 65 | Ht 74.0 in | Wt 217.0 lb

## 2015-06-13 DIAGNOSIS — Z7689 Persons encountering health services in other specified circumstances: Secondary | ICD-10-CM

## 2015-06-13 DIAGNOSIS — Z7189 Other specified counseling: Secondary | ICD-10-CM | POA: Diagnosis not present

## 2015-06-13 DIAGNOSIS — R079 Chest pain, unspecified: Secondary | ICD-10-CM

## 2015-06-13 NOTE — Progress Notes (Signed)
Patient ID: Jim Simmons, male   DOB: 1980-05-26, 35 y.o.   MRN: [id number]     Cardiology Office Note   Date:  06/13/2015   ID:  Jim Simmons, DOB [birth year], MRN [id number]  PCP:  Pcp Not In System  Cardiologist:   Jenkins Rouge, MD   No chief complaint on file.     History of Present Illness: Jim Simmons is a 35 y.o. male who presents for chest pain.  Seen by [place] 05/01/15  Complained about 2 random episodes of pressure 48 hrs apart Pain lasted a couple of hours.  Relieved with ASA.  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  He does have some GERD and reflux.  Previous heavy smoker No family history of CAD.  ECG normal at their office but has Q;s in 2,3,F with ? IMI in our office. He describes episode of prolonged SSCP 4 months ago. Lasted hours and associated with diaphoresis and nausea.  Had pain 2 days latter for 48hrs that was also bad somewhat sharper. He has a poor diet with lots of carbs. Post prandial triglycerides over 600.  Drinks beer on a daily basis and alcoholism runs in the family.  HIs wife is very anxious about any possibility of heart condition and started crying in the office  Lab Review  Hct 40.8 Cr .82 TC 276 Triglycerides 681     Past Medical History  Diagnosis Date  . Headache(784.0)     migraines  . Spinal cord tumor [facility])     Past Surgical History  Procedure Laterality Date  . Hernia repair Right     inguinal  . Laminectomy N/A 04/19/2012    Procedure: ThoracoLumbar Laminectomy with resection of spinal tumor;  Surgeon: Hosie Spangle, MD;  Location: [facility];  Service: Neurosurgery;  Laterality: N/A;  ThoracoLumbar Laminectomy with resection of spinal tumor     Current Outpatient Prescriptions  Medication Sig Dispense Refill  . cyclobenzaprine (FLEXERIL) 10 MG tablet Take 1 tablet (10 mg total) by mouth 3 (three) times daily as needed for muscle spasms. 60 tablet 1  . diclofenac (VOLTAREN) 75 MG EC tablet Take 75 mg by  mouth every 12 (twelve) hours.    Marland Kitchen HYDROcodone-acetaminophen (NORCO/VICODIN) 5-325 MG per tablet Take 1-2 tablets by mouth every 4 (four) hours as needed. 60 tablet 0  . oxyCODONE-acetaminophen (PERCOCET/ROXICET) 5-325 MG per tablet Take 1-2 tablets by mouth every 4 (four) hours as needed for pain. 60 tablet 0   No current facility-administered medications for this visit.    Allergies:   Review of patient's allergies indicates no known allergies.    Social History:  The patient  reports that he has never smoked. He has never used smokeless tobacco. He reports that he drinks alcohol. He reports that he does not use illicit drugs.   Family History:  The patient's family history is not on file.    ROS:  Please see the history of present illness.   Otherwise, review of systems are positive for none.   All other systems are reviewed and negative.    PHYSICAL EXAM: VS:  There were no vitals taken for this visit. , BMI There is no weight on file to calculate BMI. Affect appropriate Healthy:  appears stated age 1: normal Neck supple with no adenopathy JVP normal no bruits no thyromegaly Lungs clear with no wheezing and good diaphragmatic motion Heart:  S1/S2 no murmur, no rub, gallop  or click PMI normal Abdomen: benighn, BS positve, no tenderness, no AAA no bruit.  No HSM or HJR Distal pulses intact with no bruits No edema Neuro non-focal Skin warm and dry No muscular weakness    EKG:  05/01/15  SR rate 52 normal  OUr office Q;s 2,58F ? IMI    Recent Labs: No results found for requested labs within last 365 days.    Lipid Panel No results found for: CHOL, TRIG, HDL, CHOLHDL, VLDL, LDLCALC, LDLDIRECT    Wt Readings from Last 3 Encounters:  04/14/12 103.42 kg (228 lb)  04/06/12 104.327 kg (230 lb)      Other studies Reviewed: Additional studies/ records that were reviewed today include: Notes from primary care and ECG their office .    ASSESSMENT AND PLAN:  1.   Chest Pain: Smoker with poor diet and elevated triglycerides. Prolonged pain 4 months ago with IMI on ECG in our office. Alternative w/u includes cardiac CTA or ETT with echo Will try to get CT approved by [employer]. He is then and relatively bradycardic so scan should be good.  2. ETOH:  Discussed genetics of alcoholism and issues of long term cirrhosis  3. GERD: may be related to poor diet and ETOH  Pepcid.  4. Lipids:  Check fasting labs may benefit from Tricor and statin if calcium score is high 5. Spinal Cord Tumor:  Chronic neurogenic bladder issues f/u urology     Current medicines are reviewed at length with the patient today.  The patient does not have concerns regarding medicines.  The following changes have been made:  no change  Labs/ tests ordered today include: ETT/Echo or CTA per insurance   No orders of the defined types were placed in this encounter.     Disposition:   FU with me after testing      Signed, Jenkins Rouge, MD  06/13/2015 8:44 AM    [facility] [place], [place], [place]  [address] Phone: [phone]; Fax: [phone]

## 2015-06-13 NOTE — Patient Instructions (Addendum)
Medication Instructions:  Your physician recommends that you continue on your current medications as directed. Please refer to the Current Medication list given to you today.  Labwork: NONE  Testing/Procedures: NONE  Follow-Up: Your physician wants you to follow-up in: 3 months with Dr. [name]. You will receive a reminder letter in the mail two months in advance. If you don't receive a letter, please call our office to schedule the follow-up appointment.   If you need a refill on your cardiac medications before your next appointment, please call your pharmacy.

## 2015-06-14 ENCOUNTER — Emergency Department (HOSPITAL_COMMUNITY)
Admission: EM | Admit: 2015-06-14 | Discharge: 2015-06-14 | Disposition: A | Discharge status: Home / Self Care | Payer: BLUE CROSS/BLUE SHIELD | Attending: Emergency Medicine | Admitting: Emergency Medicine

## 2015-06-14 ENCOUNTER — Emergency Department (HOSPITAL_COMMUNITY): Payer: BLUE CROSS/BLUE SHIELD

## 2015-06-14 ENCOUNTER — Encounter (HOSPITAL_COMMUNITY): Payer: Self-pay

## 2015-06-14 DIAGNOSIS — Z87891 Personal history of nicotine dependence: Secondary | ICD-10-CM | POA: Diagnosis not present

## 2015-06-14 DIAGNOSIS — R079 Chest pain, unspecified: Secondary | ICD-10-CM | POA: Diagnosis not present

## 2015-06-14 DIAGNOSIS — R072 Precordial pain: Secondary | ICD-10-CM | POA: Diagnosis not present

## 2015-06-14 LAB — BASIC METABOLIC PANEL
Anion gap: 6 (ref 5–15)
BUN: 10 mg/dL (ref 6–20)
CALCIUM: 9.1 mg/dL (ref 8.9–10.3)
CO2: 24 mmol/L (ref 22–32)
Chloride: 107 mmol/L (ref 101–111)
Creatinine, Ser: 0.9 mg/dL (ref 0.61–1.24)
GFR calc Af Amer: >60 mL/min (ref >60)
GLUCOSE: 106 mg/dL — AB (ref 65–99)
Potassium: 3.9 mmol/L (ref 3.5–5.1)
Sodium: 137 mmol/L (ref 135–145)

## 2015-06-14 LAB — CBC
HEMATOCRIT: 42.8 % (ref 39.0–52.0)
Hemoglobin: 14.7 g/dL (ref 13.0–17.0)
MCH: 30.1 pg (ref 26.0–34.0)
MCHC: 34.3 g/dL (ref 30.0–36.0)
MCV: 87.5 fL (ref 78.0–100.0)
Platelets: 243 10*3/uL (ref 150–400)
RBC: 4.89 MIL/uL (ref 4.22–5.81)
RDW: 12.9 % (ref 11.5–15.5)
WBC: 6.3 10*3/uL (ref 4.0–10.5)

## 2015-06-14 LAB — I-STAT TROPONIN, ED: TROPONIN I, POC: 0 ng/mL (ref 0.00–0.08)

## 2015-06-14 MED ORDER — NITROGLYCERIN 0.4 MG SL SUBL
SUBLINGUAL_TABLET | SUBLINGUAL | Status: AC
  Filled 2015-06-14: qty 2

## 2015-06-14 MED ORDER — IOPAMIDOL (ISOVUE-370) INJECTION 76%
INTRAVENOUS | Status: AC
  Administered 2015-06-14: 80 mL
  Filled 2015-06-14: qty 100

## 2015-06-14 NOTE — ED Notes (Signed)
Pt. Presents with complaint of midsternal CP starting last night. Denies pain at this time. States sees cardiologist for abnormal EKG in past. Ambulatory AxO x4.

## 2015-06-14 NOTE — ED Provider Notes (Signed)
CSN: [id number]     Arrival date & time 06/14/15  [id number] History   First MD Initiated Contact with Patient 06/14/15 1000     Chief Complaint  Patient presents with  . Chest Pain     (Consider location/radiation/quality/duration/timing/severity/associated sxs/prior Treatment) HPI Comments: Patient is a 35 year old male who presents for evaluation of chest discomfort. He reports he's been having these episodes intermittently for several weeks. Yesterday evening, he experienced another episode which was worse. This began as he was attempting a down to go to sleep. He tried taking some antacids with some relief. He was also seen yesterday at the cardiology office by Dr. Frances Nickels who arranged for some outpatient testing. Patient denies any history of cardiac disease. He has no risk factors with the exception of a family history.  Patient is a 35 y.o. male presenting with chest pain. The history is provided by the patient.  Chest Pain Pain location:  Substernal area Pain quality: pressure   Pain radiates to:  Does not radiate Pain radiates to the back: no   Pain severity:  Moderate Timing:  Intermittent Progression:  Partially resolved Chronicity:  New Relieved by:  Nothing Worsened by:  Nothing tried Ineffective treatments:  None tried   Past Medical History  Diagnosis Date  . Headache(784.0)     migraines  . Spinal cord tumor [facility])    Past Surgical History  Procedure Laterality Date  . Hernia repair Right     inguinal  . Laminectomy N/A 04/19/2012    Procedure: ThoracoLumbar Laminectomy with resection of spinal tumor;  Surgeon: Hosie Spangle, MD;  Location: [place] [facility];  Service: Neurosurgery;  Laterality: N/A;  ThoracoLumbar Laminectomy with resection of spinal tumor   Family History  Problem Relation Age of Onset  . Heart attack Maternal Uncle    Social History  Substance Use Topics  . Smoking status: Former Research scientist (life sciences)  . Smokeless tobacco: Never Used  . Alcohol Use: 0.0  oz/week    0 Standard drinks or equivalent per week     Comment: social    Review of Systems  Cardiovascular: Positive for chest pain.  All other systems reviewed and are negative.     Allergies  Review of patient's allergies indicates no known allergies.  Home Medications   Prior to Admission medications   Not on File   There were no vitals taken for this visit. Physical Exam  Constitutional: He is oriented to person, place, and time. He appears well-developed and well-nourished. No distress.  HENT:  Head: Normocephalic and atraumatic.  Mouth/Throat: Oropharynx is clear and moist.  Neck: Normal range of motion. Neck supple.  Cardiovascular: Normal rate and regular rhythm.  Exam reveals no friction rub.   No murmur heard. Pulmonary/Chest: Effort normal and breath sounds normal. No respiratory distress. He has no wheezes. He has no rales.  Abdominal: Soft. Bowel sounds are normal. He exhibits no distension. There is no tenderness.  Musculoskeletal: Normal range of motion. He exhibits no edema.  Neurological: He is alert and oriented to person, place, and time. Coordination normal.  Skin: Skin is warm and dry. He is not diaphoretic.  Nursing note and vitals reviewed.   ED Course  Procedures (including critical care time) Labs Review Labs Reviewed  BASIC METABOLIC PANEL  [place], ED    Imaging Review No results found. I have personally reviewed and evaluated these images and lab results as part of my medical decision-making.   EKG Interpretation  Date/Time:  Saturday June 14 2015 10:00:18 EDT Ventricular Rate:  47 PR Interval:  131 QRS Duration: 96 QT Interval:  431 QTC Calculation: 381 R Axis:   84 Text Interpretation:  Sinus bradycardia Confirmed by Polo Mcmartin  MD, Deundre Thong  ([id number]) on 06/14/2015 10:06:26 AM      MDM   Final diagnoses:  Chest pain    Patient seen by Dr. Frances Nickels in the emergency department who ordered a coronary CT. This  revealed no acute abnormality. He will be discharged with instructions to follow-up as needed.    Veryl Speak, MD 06/14/15 [phone]

## 2015-06-14 NOTE — Discharge Instructions (Signed)
Nonspecific Chest Pain  °Chest pain can be caused by many different conditions. There is always a chance that your pain could be related to something serious, such as a heart attack or a blood clot in your lungs. Chest pain can also be caused by conditions that are not life-threatening. If you have chest pain, it is very important to follow up with your health care provider. °CAUSES  °Chest pain can be caused by: °· Heartburn. °· Pneumonia or bronchitis. °· Anxiety or stress. °· Inflammation around your heart (pericarditis) or lung (pleuritis or pleurisy). °· A blood clot in your lung. °· A collapsed lung (pneumothorax). It can develop suddenly on its own (spontaneous pneumothorax) or from trauma to the chest. °· Shingles infection (varicella-zoster virus). °· Heart attack. °· Damage to the bones, muscles, and cartilage that make up your chest wall. This can include: °¨ Bruised bones due to injury. °¨ Strained muscles or cartilage due to frequent or repeated coughing or overwork. °¨ Fracture to one or more ribs. °¨ Sore cartilage due to inflammation (costochondritis). °RISK FACTORS  °Risk factors for chest pain may include: °· Activities that increase your risk for trauma or injury to your chest. °· Respiratory infections or conditions that cause frequent coughing. °· Medical conditions or overeating that can cause heartburn. °· Heart disease or family history of heart disease. °· Conditions or health behaviors that increase your risk of developing a blood clot. °· Having had chicken pox (varicella zoster). °SIGNS AND SYMPTOMS °Chest pain can feel like: °· Burning or tingling on the surface of your chest or deep in your chest. °· Crushing, pressure, aching, or squeezing pain. °· Dull or sharp pain that is worse when you move, cough, or take a deep breath. °· Pain that is also felt in your back, neck, shoulder, or arm, or pain that spreads to any of these areas. °Your chest pain may come and go, or it may stay  constant. °DIAGNOSIS °Lab tests or other studies may be needed to find the cause of your pain. Your health care provider may have you take a test called an ambulatory ECG (electrocardiogram). An ECG records your heartbeat patterns at the time the test is performed. You may also have other tests, such as: °· Transthoracic echocardiogram (TTE). During echocardiography, sound waves are used to create a picture of all of the heart structures and to look at how blood flows through your heart. °· Transesophageal echocardiogram (TEE). This is a more advanced imaging test that obtains images from inside your body. It allows your health care provider to see your heart in finer detail. °· Cardiac monitoring. This allows your health care provider to monitor your heart rate and rhythm in real time. °· Holter monitor. This is a portable device that records your heartbeat and can help to diagnose abnormal heartbeats. It allows your health care provider to track your heart activity for several days, if needed. °· Stress tests. These can be done through exercise or by taking medicine that makes your heart beat more quickly. °· Blood tests. °· Imaging tests. °TREATMENT  °Your treatment depends on what is causing your chest pain. Treatment may include: °· Medicines. These may include: °¨ Acid blockers for heartburn. °¨ Anti-inflammatory medicine. °¨ Pain medicine for inflammatory conditions. °¨ Antibiotic medicine, if an infection is present. °¨ Medicines to dissolve blood clots. °¨ Medicines to treat coronary artery disease. °· Supportive care for conditions that do not require medicines. This may include: °¨ Resting. °¨ Applying heat   or cold packs to injured areas. °¨ Limiting activities until pain decreases. °HOME CARE INSTRUCTIONS °· If you were prescribed an antibiotic medicine, finish it all even if you start to feel better. °· Avoid any activities that bring on chest pain. °· Do not use any tobacco products, including  cigarettes, chewing tobacco, or electronic cigarettes. If you need help quitting, ask your health care provider. °· Do not drink alcohol. °· Take medicines only as directed by your health care provider. °· Keep all follow-up visits as directed by your health care provider. This is important. This includes any further testing if your chest pain does not go away. °· If heartburn is the cause for your chest pain, you may be told to keep your head raised (elevated) while sleeping. This reduces the chance that acid will go from your stomach into your esophagus. °· Make lifestyle changes as directed by your health care provider. These may include: °¨ Getting regular exercise. Ask your health care provider to suggest some activities that are safe for you. °¨ Eating a heart-healthy diet. A registered dietitian can help you to learn healthy eating options. °¨ Maintaining a healthy weight. °¨ Managing diabetes, if necessary. °¨ Reducing stress. °SEEK MEDICAL CARE IF: °· Your chest pain does not go away after treatment. °· You have a rash with blisters on your chest. °· You have a fever. °SEEK IMMEDIATE MEDICAL CARE IF:  °· Your chest pain is worse. °· You have an increasing cough, or you cough up blood. °· You have severe abdominal pain. °· You have severe weakness. °· You faint. °· You have chills. °· You have sudden, unexplained chest discomfort. °· You have sudden, unexplained discomfort in your arms, back, neck, or jaw. °· You have shortness of breath at any time. °· You suddenly start to sweat, or your skin gets clammy. °· You feel nauseous or you vomit. °· You suddenly feel light-headed or dizzy. °· Your heart begins to beat quickly, or it feels like it is skipping beats. °These symptoms may represent a serious problem that is an emergency. Do not wait to see if the symptoms will go away. Get medical help right away. Call your local emergency services (911 in the [place] Do not drive yourself to the hospital. °  °This  information is not intended to replace advice given to you by your health care provider. Make sure you discuss any questions you have with your health care provider. °  °Document Released: 09/30/2004 Document Revised: 01/11/2014 Document Reviewed: 07/27/2013 °Elsevier Interactive Patient Education ©2016 [employer] ° °

## 2015-06-14 NOTE — ED Notes (Signed)
Pulled 2 sublingual nitroglycerin from pyxis as override per Dr. [name] order for CT.

## 2015-06-14 NOTE — Consult Note (Signed)
CARDIOLOGY CONSULT NOTE       Patient ID: Jim Simmons MRN: [id number] DOB/AGE: [birth year] 36 y.o.  Admit date: 06/14/2015 Referring Physician: Stark Simmons Primary Physician: Jim Simmons Primary Cardiologist: Jim Simmons Reason for Consultation: Chest Pain  Active Problems:   * No active hospital problems. *   HPI:  35 y.o. seen in office yesterday.  SSCP Long severe episode 4 months ago then recurred 2 weeks latter. Associated with diaphoresis.  Last 2 weeks increasing SSCP mid chest radiates to arms This am started again. 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  Previous smoker. Poor diet with non Fasting Triglycerides over 600. No dyspnea this am no pleuritic component no trauma.  Pain resolved at this point. Given abnormal ECG and recurrent pains will plan on doing cardiac CTA as part of  Chest pain protocol in ER.  Discussed with nurse 18 gauge antecubital iv. Patient willing to proceed. Have talked with CT tech.  Has had contrast before for w/u of spinal tumor Istat troponin negative And ECG with no acute changes  Seems to have lots of stress / anxiety.  ETOH is daily more on weekends Usually beer  ROS All other systems reviewed and negative except as noted above  Past Medical History  Diagnosis Date  . Headache(784.0)     migraines  . Spinal cord tumor [facility])     Family History  Problem Relation Age of Onset  . Heart attack Maternal Uncle     Social History   Social History  . Marital Status: Married    Spouse Name: N/A  . Number of Children: N/A  . Years of Education: N/A   Occupational History  . Not on file.   Social History Main Topics  . Smoking status: Former Research scientist (life sciences)  . Smokeless tobacco: Never Used  . Alcohol Use: 0.0 oz/week    0 Standard drinks or equivalent per week     Comment: social  . Drug Use: No  . Sexual Activity: Yes   Other Topics Concern  .  Not on file   Social History Narrative    Past Surgical History  Procedure Laterality Date  . Hernia repair Right     inguinal  . Laminectomy N/A 04/19/2012    Procedure: ThoracoLumbar Laminectomy with resection of spinal tumor;  Surgeon: Jim Spangle, MD;  Location: [place] [facility];  Service: Neurosurgery;  Laterality: N/A;  ThoracoLumbar Laminectomy with resection of spinal tumor     . nitroGLYCERIN          Physical Exam: There were no vitals taken for this visit.   Affect appropriate Healthy:  appears stated age 35: normal Neck supple with no adenopathy JVP normal no bruits no thyromegaly Lungs clear with no wheezing and good diaphragmatic motion Heart:  S1/S2 no murmur, no rub, gallop or click PMI normal Abdomen: benighn, BS positve, no tenderness, no AAA no bruit.  No HSM or HJR Distal pulses intact with no bruits No edema Neuro non-focal Skin warm and dry No muscular weakness   Labs:   Lab Results  Component Value Date   WBC 9.1 04/14/2012   HGB 15.6 04/14/2012   HCT 43.4 04/14/2012   MCV 85.6 04/14/2012   PLT 282 04/14/2012   No results for input(s): NA, K, CL, CO2, BUN, CREATININE, CALCIUM, PROT, BILITOT, ALKPHOS, ALT, AST, GLUCOSE in the last 168 hours.  Invalid input(s): LABALBU No results found for: CKTOTAL, CKMB, CKMBINDEX, TROPONINI No results found for: CHOL No results found for: HDL No results found for: LDLCALC No results found for: TRIG No results found for: CHOLHDL No results found for: LDLDIRECT    Radiology: No results found.  EKG: NSR Q waves 2,3,F no acute ST/T wave changes    ASSESSMENT AND PLAN:  Chest Pain: recurrent. Previous smoker with abnormal ECG ? 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  Cardiac CTA planned this am ETOH:  Discussed long term risk of cirrhosis and lifestyle changes Urology: previous spinal cord surgery with neurogenic bladder stable no current retention   Signed: Jenkins Simmons 06/14/2015,  10:19 AM

## 2015-06-14 NOTE — ED Notes (Signed)
Patient transported to CT 

## 2015-09-11 ENCOUNTER — Emergency Department (HOSPITAL_COMMUNITY)
Admission: EM | Admit: 2015-09-11 | Discharge: 2015-09-11 | Discharge status: Absent without leave | Payer: BLUE CROSS/BLUE SHIELD

## 2015-09-11 NOTE — ED Notes (Signed)
Writer called for triage room, no response 

## 2018-01-13 IMAGING — CT CT HEART MORP W/ CTA COR W/ SCORE W/ CA W/CM &/OR W/O CM
1 of 10 series · 1 of 20 positions shown, 2 images · IV contrast (Iodine)
Comparison: None.

CLINICAL DATA: Chest pain

EXAM:
Cardiac CTA
MEDICATIONS:
Sub lingual nitro. 4mg and lopressor 0mg
TECHNIQUE: The patient was scanned on a [employer] [REDACTED]ice scanner. Gantry
rotation speed was 270 msecs. Collimation was .9mm. A 100 kV
prospective scan was triggered in the descending thoracic aorta at
111 HU's with 5% padding centered around 78% of the R-R interval.
Average HR during the scan was 68 bpm. The 3D data set was
interpreted on a dedicated work station using MPR, MIP and VRT
modes. A total of 80cc of contrast was used.

[Series 300: locator · axial · 0.35mm/px · z∈[+934,+934]mm · 1 of 1 slices shown, 2 images]
[im 1/1  vessel]
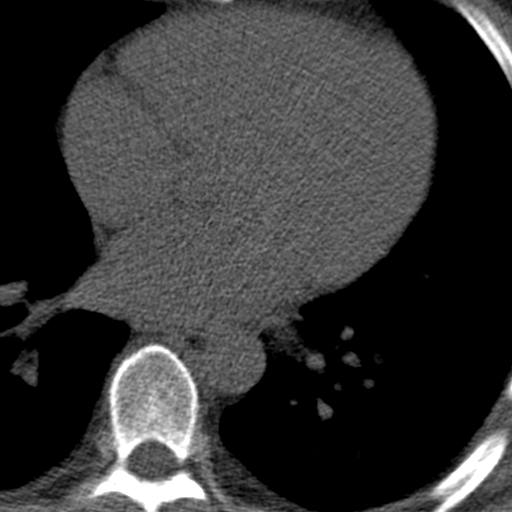
[im 1/1  lung]
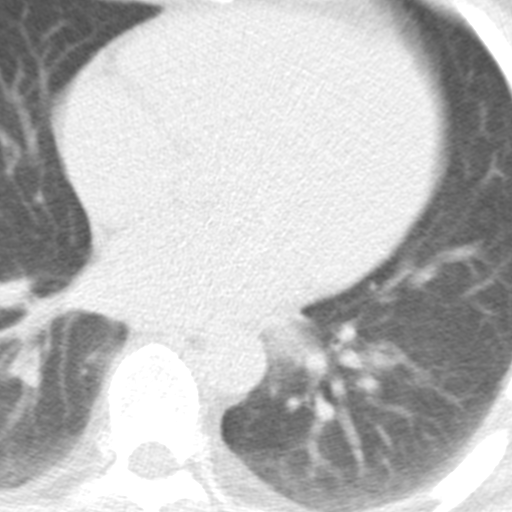

[1 of 20 positions shown; findings below may reference images not displayed]

FINDINGS: Non-cardiac: See separate report from [REDACTED]. No
significant findings on limited lung and soft tissue windows.

Calcium Score: 0

Coronary Arteries: Right dominant with no anomalies

LM: Normal

LAD:  Normal

D1: Normal

D2: Normal

Circumflex:  Normal mostly one OM and AV groove branch

OM1: Normal

RCA:  Dominant and normal

PDA: Nornmal

PLA:  Normal
IMPRESSION: Normal right dominant coronary arteries with no anomalies. Calcium
Score 0

Polin Billiot

EXAM:
OVER-READ INTERPRETATION  CT CHEST

The following report is an over-read performed by radiologist Dr.
over-read does not include interpretation of cardiac or coronary
anatomy or pathology. The coronary calcium score/coronary CTA
interpretation by the cardiologist is attached.
FINDINGS: Mild residual thymus is seen in the anterior mediastinum with
concave borders. The central great vessels are normal in appearance.
No effusions. No adenopathy identified. Visualized esophagus is
normal.

The visualized upper abdomen is normal.

There is loss of anterior height of a mid thoracic vertebral body
which has a chronic appearance. No other acute bony abnormalities
IMPRESSION: 1. No acute abnormalities.

## 2019-01-11 ENCOUNTER — Encounter: Payer: Self-pay | Admitting: *Deleted

## 2019-01-18 ENCOUNTER — Ambulatory Visit: Payer: Self-pay | Admitting: Gastroenterology

## 2019-09-08 ENCOUNTER — Telehealth: Payer: Self-pay | Admitting: Infectious Diseases

## 2019-09-08 ENCOUNTER — Other Ambulatory Visit: Payer: Self-pay | Admitting: Infectious Diseases

## 2019-09-08 DIAGNOSIS — Z6830 Body mass index (BMI) 30.0-30.9, adult: Secondary | ICD-10-CM

## 2019-09-08 DIAGNOSIS — U071 COVID-19: Secondary | ICD-10-CM

## 2019-09-08 NOTE — Telephone Encounter (Signed)
Called to Discuss with patient about Covid symptoms and the use of the monoclonal antibody infusion for those with mild to moderate Covid symptoms and at a high risk of hospitalization.     Pt appears to qualify for this infusion due to co-morbid conditions and/or a member of an at-risk group in accordance with the FDA Emergency Use Authorization.     Sx started 9/1  BMI 30   Will schedule for Sunday

## 2019-09-08 NOTE — Progress Notes (Signed)
I connected by phone with [name] on 09/08/2019 at 2:42 PM to discuss the potential use of a new treatment for mild to moderate COVID-19 viral infection in non-hospitalized patients.  This patient is a 39 y.o. male that meets the FDA criteria for Emergency Use Authorization of COVID monoclonal antibody casirivimab/imdevimab.  Has a (+) direct SARS-CoV-2 viral test result  Has mild or moderate COVID-19   Is NOT hospitalized due to COVID-19  Is within 10 days of symptom onset  Has at least one of the high risk factor(s) for progression to severe COVID-19 and/or hospitalization as defined in EUA.  Specific high risk criteria : BMI > 25   I have spoken and communicated the following to the patient or parent/caregiver regarding COVID monoclonal antibody treatment:  1. FDA has authorized the emergency use for the treatment of mild to moderate COVID-19 in adults and pediatric patients with positive results of direct SARS-CoV-2 viral testing who are 27 years of age and older weighing at least 40 kg, and who are at high risk for progressing to severe COVID-19 and/or hospitalization.  2. The significant known and potential risks and benefits of COVID monoclonal antibody, and the extent to which such potential risks and benefits are unknown.  3. Information on available alternative treatments and the risks and benefits of those alternatives, including clinical trials.  4. Patients treated with COVID monoclonal antibody should continue to self-isolate and use infection control measures (e.g., wear mask, isolate, social distance, avoid sharing personal items, clean and disinfect "high touch" surfaces, and frequent handwashing) according to CDC guidelines.   5. The patient or parent/caregiver has the option to accept or refuse COVID monoclonal antibody treatment.  After reviewing this information with the patient, The patient agreed to proceed with receiving casirivimab\imdevimab infusion and will  be provided a copy of the Fact sheet prior to receiving the infusion. [name] 09/08/2019 2:42 PM

## 2019-09-09 ENCOUNTER — Ambulatory Visit (HOSPITAL_COMMUNITY)
Admission: RE | Admit: 2019-09-09 | Discharge: 2019-09-09 | Disposition: A | Discharge status: Home / Self Care | Payer: BC Managed Care – PPO | Source: Ambulatory Visit | Attending: Pulmonary Disease | Admitting: Pulmonary Disease

## 2019-09-09 DIAGNOSIS — Z6830 Body mass index (BMI) 30.0-30.9, adult: Secondary | ICD-10-CM | POA: Diagnosis present

## 2019-09-09 DIAGNOSIS — U071 COVID-19: Secondary | ICD-10-CM

## 2019-09-09 MED ORDER — DIPHENHYDRAMINE HCL 50 MG/ML IJ SOLN: 50.0000 mg | Freq: Once | INTRAMUSCULAR | Status: DC | PRN

## 2019-09-09 MED ORDER — FAMOTIDINE IN NACL 20-0.9 MG/50ML-% IV SOLN: 20.0000 mg | Freq: Once | INTRAVENOUS | Status: DC | PRN

## 2019-09-09 MED ORDER — SODIUM CHLORIDE 0.9 % IV SOLN
1200.0000 mg | Freq: Once | INTRAVENOUS | Status: AC
  Administered 2019-09-09: 1200 mg via INTRAVENOUS
  Filled 2019-09-09: qty 10

## 2019-09-09 MED ORDER — ALBUTEROL SULFATE HFA 108 (90 BASE) MCG/ACT IN AERS: 2.0000 | INHALATION_SPRAY | Freq: Once | RESPIRATORY_TRACT | Status: DC | PRN

## 2019-09-09 MED ORDER — SODIUM CHLORIDE 0.9 % IV SOLN: INTRAVENOUS | Status: DC | PRN

## 2019-09-09 MED ORDER — METHYLPREDNISOLONE SODIUM SUCC 125 MG IJ SOLR: 125.0000 mg | Freq: Once | INTRAMUSCULAR | Status: DC | PRN

## 2019-09-09 MED ORDER — EPINEPHRINE 0.3 MG/0.3ML IJ SOAJ: 0.3000 mg | Freq: Once | INTRAMUSCULAR | Status: DC | PRN

## 2019-09-09 NOTE — Progress Notes (Signed)
  Diagnosis: COVID-19  Physician: Dr. Wright  Procedure: Covid Infusion Clinic Med: casirivimab\imdevimab infusion - Provided patient with casirivimab\imdevimab fact sheet for patients, parents and caregivers prior to infusion.  Complications: No immediate complications noted.  Discharge: Discharged home   Jim Pearman  Simmons 09/09/2019   

## 2019-09-09 NOTE — Discharge Instructions (Addendum)

## 2019-09-12 DIAGNOSIS — Z8616 Personal history of COVID-19: Secondary | ICD-10-CM

## 2019-09-12 HISTORY — DX: Personal history of COVID-19: Z86.16

## 2020-04-04 DIAGNOSIS — S42302A Unspecified fracture of shaft of humerus, left arm, initial encounter for closed fracture: Secondary | ICD-10-CM

## 2020-04-04 HISTORY — DX: Unspecified fracture of shaft of humerus, left arm, initial encounter for closed fracture: S42.302A

## 2020-04-21 ENCOUNTER — Other Ambulatory Visit: Payer: Self-pay

## 2020-04-21 ENCOUNTER — Other Ambulatory Visit (HOSPITAL_COMMUNITY)
Admission: RE | Admit: 2020-04-21 | Discharge: 2020-04-21 | Disposition: A | Discharge status: Home / Self Care | Payer: BC Managed Care – PPO | Source: Ambulatory Visit | Attending: Orthopedic Surgery | Admitting: Orthopedic Surgery

## 2020-04-21 ENCOUNTER — Encounter (HOSPITAL_BASED_OUTPATIENT_CLINIC_OR_DEPARTMENT_OTHER): Payer: Self-pay | Admitting: Orthopedic Surgery

## 2020-04-21 DIAGNOSIS — Z01812 Encounter for preprocedural laboratory examination: Secondary | ICD-10-CM | POA: Insufficient documentation

## 2020-04-21 DIAGNOSIS — Y92009 Unspecified place in unspecified non-institutional (private) residence as the place of occurrence of the external cause: Secondary | ICD-10-CM | POA: Diagnosis not present

## 2020-04-21 DIAGNOSIS — S42302A Unspecified fracture of shaft of humerus, left arm, initial encounter for closed fracture: Secondary | ICD-10-CM | POA: Diagnosis not present

## 2020-04-21 DIAGNOSIS — Z20822 Contact with and (suspected) exposure to covid-19: Secondary | ICD-10-CM | POA: Insufficient documentation

## 2020-04-21 DIAGNOSIS — W109XXA Fall (on) (from) unspecified stairs and steps, initial encounter: Secondary | ICD-10-CM | POA: Diagnosis not present

## 2020-04-21 DIAGNOSIS — Z8616 Personal history of COVID-19: Secondary | ICD-10-CM | POA: Diagnosis not present

## 2020-04-21 LAB — SARS CORONAVIRUS 2 (TAT 6-24 HRS): SARS Coronavirus 2: NEGATIVE

## 2020-04-21 NOTE — H&P (Signed)
PREOPERATIVE H&P  Chief Complaint: LEFT HUMERAL SHAFT FRACTURE  HPI: Jim Simmons is a 40 y.o. male who presents with a diagnosis of LEFT HUMERAL SHAFT FRACTURE. He tripped and fell down a few steps at home on 04/18/20 landing directly on his left shoulder. He went to the ER the next day where xrays showed a left humeral shaft fracture. He was placed in a long arm splint and given a cuff and collar sling. He follows up today with increased pain and difficulty getting comfortable. He says he can frequently feel the bones in his left arm shifting. He is also having right knee and heel pain as well. This has caused him to limp. Symptoms are rated as moderate to severe, and have been worsening.  This is significantly impairing activities of daily living.  He has elected for surgical management.   Past Medical History:  Diagnosis Date  . Chronic constipation   . Fracture of humeral shaft, left, closed 04/2020  . GERD (gastroesophageal reflux disease)   . History of 2019 novel coronavirus disease (COVID-19) 09/12/2019   positive test result scanned in epic,  per pt moderate symptoms no hospital admission, received monoclonol antibodies, all symptoms resolved  . History of benign spinal cord tumor    04-19-2012  s/p  T12 -- L2 laminectomy with total resection cauda equina benign ependymoma;  prior to surgery pt had low back pain and incompleted emptying of bladder  . History of chest pain    per had evaluation by cardiology, dr [name], office note in epic 06-13-2015,  had coronary CT 06-14-2015 calcium score zero and normal coronaries  . History of urinary retention    due to neuogenic bladder  . Neurogenic bladder    urologist--- dr Raliegh Ip. [place] ([place]  due to spinal tumor (benign),  pt self cath's  . Self-catheterizes urinary bladder    Past Surgical History:  Procedure Laterality Date  . INGUINAL HERNIA REPAIR Right age 30  . LAMINECTOMY N/A 04/19/2012   Procedure: ThoracoLumbar  Laminectomy with resection of spinal tumor;  Surgeon: Hosie Spangle, MD;  Location: [facility];  Service: Neurosurgery;  Laterality: N/A;  ThoracoLumbar Laminectomy with resection of spinal tumor   Social History   Socioeconomic History  . Marital status: Married    Spouse name: Not on file  . Number of children: Not on file  . Years of education: Not on file  . Highest education level: Not on file  Occupational History  . Not on file  Tobacco Use  . Smoking status: Never Smoker  . Smokeless tobacco: Never Used  Vaping Use  . Vaping Use: Never used  Substance and Sexual Activity  . Alcohol use: Yes    Alcohol/week: 1.0 standard drink    Types: 1 Standard drinks or equivalent per week    Comment: social  . Drug use: No  . Sexual activity: Yes  Other Topics Concern  . Not on file  Social History Narrative  . Not on file   Social Determinants of Health   Financial Resource Strain: Not on file  Food Insecurity: Not on file  Transportation Needs: Not on file  Physical Activity: Not on file  Stress: Not on file  Social Connections: Not on file   Family History  Problem Relation Age of Onset  . Heart attack Maternal Uncle    No Known Allergies Prior to Admission medications   Medication Sig Start Date End Date Taking? Authorizing Provider  fexofenadine-pseudoephedrine (  ALLEGRA-D) 60-120 MG 12 hr tablet Take 1 tablet by mouth 2 (two) times daily as needed.   Yes [provider]  Multiple Vitamins-Minerals (MULTIVITAMIN WITH MINERALS) tablet Take 1 tablet by mouth daily.   Yes [provider]  oxyCODONE-acetaminophen (PERCOCET/ROXICET) 5-325 MG tablet Take by mouth every 4 (four) hours as needed for severe pain.   Yes [provider]  Polyethylene Glycol 3350 (MIRALAX PO) Take by mouth as needed.   Yes [provider]     Positive ROS: All other systems have been reviewed and were otherwise negative with the exception of those  mentioned in the HPI and as above.  Physical Exam: General: Alert, no acute distress Cardiovascular: No pedal edema Respiratory: No cyanosis, no use of accessory musculature GI: No organomegaly, abdomen is soft and non-tender Skin: No lesions in the area of chief complaint Neurologic: Sensation intact distally Psychiatric: Patient is competent for consent with normal mood and affect Lymphatic: No axillary or cervical lymphadenopathy  MUSCULOSKELETAL: NVI but decreased sensation in the left hand, left hand is swollen and bruised, able to make a fist and give thumbs up, TTP left upper arm, TTP right fibular head, TTP right calcaneous    Assessment: LEFT HUMERAL SHAFT FRACTURE No fracture seen at the right fibular head or right calcaneous. Likely bone bruising  Plan: Plan for Procedure(s): OPEN REDUCTION INTERNAL FIXATION (ORIF) HUMERAL SHAFT FRACTURE  The risks benefits and alternatives were discussed with the patient including but not limited to the risks of nonoperative treatment, versus surgical intervention including infection, bleeding, nerve injury,  blood clots, cardiopulmonary complications, morbidity, mortality, among others, and they were willing to proceed.   Weightbearing: NWB LUE Orthopedic devices: sling Showering: POD 3 but keep dressings dry Dressing: reinforce as needed Medicines: Oxycodone, Tylenol, Mobic, Robaxin, Zofran  Discharge: home Follow up:  7-10 days post op   Alisa Graff Office [phone] 04/21/2020 2:57 PM

## 2020-04-21 NOTE — Progress Notes (Addendum)
Spoke w/ via phone for pre-op interview--- PT Lab needs dos----  no (per anes)/  Pre-op orders pending            Lab results------ no COVID test ------ 04-21-2020 @ 1230 Arrive at ------- 0815 on 04-22-2020  NPO after MN NO Solid Food.  Clear liquids from MN until--- 0715 Med rec completed Medications to take morning of surgery ----- oxycodone if needed Diabetic medication ----- n/a Patient instructed to bring photo id and insurance card day of surgery Patient aware to have Driver (ride ) / caregiver    for 24 hours after surgery -- wife, Gena Patient Special Instructions --- pt self'cath's for neurogenic bladder, asked to bring his cathether since he will need to empty his bladder prior to surgery Pre-Op special Istructions ----- pre-op orders pending, case just added on today Patient verbalized understanding of instructions that were given at this phone interview. Patient denies shortness of breath, chest pain, fever, cough at this phone interview.

## 2020-04-22 ENCOUNTER — Encounter (HOSPITAL_BASED_OUTPATIENT_CLINIC_OR_DEPARTMENT_OTHER): Payer: Self-pay | Admitting: Orthopedic Surgery

## 2020-04-22 ENCOUNTER — Ambulatory Visit (HOSPITAL_BASED_OUTPATIENT_CLINIC_OR_DEPARTMENT_OTHER): Payer: BC Managed Care – PPO | Admitting: Anesthesiology

## 2020-04-22 ENCOUNTER — Ambulatory Visit (HOSPITAL_BASED_OUTPATIENT_CLINIC_OR_DEPARTMENT_OTHER)
Admission: RE | Admit: 2020-04-22 | Discharge: 2020-04-22 | Disposition: A | Discharge status: Home / Self Care | Payer: BC Managed Care – PPO | Attending: Orthopedic Surgery | Admitting: Orthopedic Surgery

## 2020-04-22 ENCOUNTER — Encounter (HOSPITAL_BASED_OUTPATIENT_CLINIC_OR_DEPARTMENT_OTHER)
Admission: RE | Disposition: A | Discharge status: Home / Self Care | Payer: Self-pay | Source: Home / Self Care | Attending: Orthopedic Surgery

## 2020-04-22 DIAGNOSIS — S42302A Unspecified fracture of shaft of humerus, left arm, initial encounter for closed fracture: Secondary | ICD-10-CM | POA: Insufficient documentation

## 2020-04-22 DIAGNOSIS — W109XXA Fall (on) (from) unspecified stairs and steps, initial encounter: Secondary | ICD-10-CM | POA: Insufficient documentation

## 2020-04-22 DIAGNOSIS — Z8616 Personal history of COVID-19: Secondary | ICD-10-CM | POA: Insufficient documentation

## 2020-04-22 DIAGNOSIS — Z20822 Contact with and (suspected) exposure to covid-19: Secondary | ICD-10-CM | POA: Insufficient documentation

## 2020-04-22 DIAGNOSIS — Y92009 Unspecified place in unspecified non-institutional (private) residence as the place of occurrence of the external cause: Secondary | ICD-10-CM | POA: Insufficient documentation

## 2020-04-22 HISTORY — DX: Personal history of other benign neoplasm: Z86.018

## 2020-04-22 HISTORY — DX: Neuromuscular dysfunction of bladder, unspecified: N31.9

## 2020-04-22 HISTORY — DX: Gastro-esophageal reflux disease without esophagitis: K21.9

## 2020-04-22 HISTORY — DX: Personal history of other specified conditions: Z87.898

## 2020-04-22 HISTORY — DX: Other specified health status: Z78.9

## 2020-04-22 HISTORY — PX: ORIF HUMERUS FRACTURE: SHX2126

## 2020-04-22 HISTORY — DX: Other constipation: K59.09

## 2020-04-22 SURGERY — OPEN REDUCTION INTERNAL FIXATION (ORIF) HUMERAL SHAFT FRACTURE
Anesthesia: Regional | Laterality: Left

## 2020-04-22 MED ORDER — ROCURONIUM BROMIDE 10 MG/ML (PF) SYRINGE
PREFILLED_SYRINGE | INTRAVENOUS | Status: AC
  Filled 2020-04-22: qty 10

## 2020-04-22 MED ORDER — FENTANYL CITRATE (PF) 100 MCG/2ML IJ SOLN
50.0000 ug | Freq: Once | INTRAMUSCULAR | Status: AC
  Administered 2020-04-22: 50 ug via INTRAVENOUS

## 2020-04-22 MED ORDER — LIDOCAINE 2% (20 MG/ML) 5 ML SYRINGE
INTRAMUSCULAR | Status: AC
  Filled 2020-04-22: qty 5

## 2020-04-22 MED ORDER — DEXAMETHASONE SODIUM PHOSPHATE 10 MG/ML IJ SOLN: 8.0000 mg | Freq: Once | INTRAMUSCULAR | Status: DC

## 2020-04-22 MED ORDER — FENTANYL CITRATE (PF) 100 MCG/2ML IJ SOLN
INTRAMUSCULAR | Status: AC
  Filled 2020-04-22: qty 2

## 2020-04-22 MED ORDER — ONDANSETRON HCL 4 MG/2ML IJ SOLN
INTRAMUSCULAR | Status: AC
  Filled 2020-04-22: qty 2

## 2020-04-22 MED ORDER — FENTANYL CITRATE (PF) 100 MCG/2ML IJ SOLN
25.0000 ug | INTRAMUSCULAR | Status: DC | PRN
  Administered 2020-04-22: 25 ug via INTRAVENOUS
  Administered 2020-04-22: 50 ug via INTRAVENOUS

## 2020-04-22 MED ORDER — DEXAMETHASONE SODIUM PHOSPHATE 10 MG/ML IJ SOLN
INTRAMUSCULAR | Status: DC | PRN
  Administered 2020-04-22: 8 mg via INTRAVENOUS

## 2020-04-22 MED ORDER — CEFAZOLIN SODIUM-DEXTROSE 2-4 GM/100ML-% IV SOLN
INTRAVENOUS | Status: AC
  Filled 2020-04-22: qty 100

## 2020-04-22 MED ORDER — OXYCODONE HCL 5 MG PO TABS
5.0000 mg | ORAL_TABLET | Freq: Once | ORAL | Status: AC | PRN
  Administered 2020-04-22: 5 mg via ORAL

## 2020-04-22 MED ORDER — LIDOCAINE 2% (20 MG/ML) 5 ML SYRINGE
INTRAMUSCULAR | Status: DC | PRN
  Administered 2020-04-22: 60 mg via INTRAVENOUS

## 2020-04-22 MED ORDER — MIDAZOLAM HCL 2 MG/2ML IJ SOLN
INTRAMUSCULAR | Status: AC
  Filled 2020-04-22: qty 2

## 2020-04-22 MED ORDER — ACETAMINOPHEN 500 MG PO TABS
ORAL_TABLET | ORAL | Status: AC
  Filled 2020-04-22: qty 2

## 2020-04-22 MED ORDER — ACETAMINOPHEN 500 MG PO TABS: 1000.0000 mg | ORAL_TABLET | Freq: Four times a day (QID) | ORAL | 0 refills | Status: AC | PRN

## 2020-04-22 MED ORDER — PROPOFOL 10 MG/ML IV BOLUS
INTRAVENOUS | Status: DC | PRN
  Administered 2020-04-22: 270 mg via INTRAVENOUS

## 2020-04-22 MED ORDER — ONDANSETRON HCL 4 MG PO TABS: 4.0000 mg | ORAL_TABLET | Freq: Every day | ORAL | 0 refills | Status: AC | PRN

## 2020-04-22 MED ORDER — BUPIVACAINE LIPOSOME 1.3 % IJ SUSP
INTRAMUSCULAR | Status: DC | PRN
  Administered 2020-04-22: 10 mL

## 2020-04-22 MED ORDER — CEFAZOLIN SODIUM-DEXTROSE 2-4 GM/100ML-% IV SOLN
2.0000 g | INTRAVENOUS | Status: AC
  Administered 2020-04-22: 2 g via INTRAVENOUS

## 2020-04-22 MED ORDER — TRANEXAMIC ACID-NACL 1000-0.7 MG/100ML-% IV SOLN
INTRAVENOUS | Status: AC
  Filled 2020-04-22: qty 100

## 2020-04-22 MED ORDER — TRANEXAMIC ACID-NACL 1000-0.7 MG/100ML-% IV SOLN
1000.0000 mg | INTRAVENOUS | Status: AC
  Administered 2020-04-22: 1000 mg via INTRAVENOUS

## 2020-04-22 MED ORDER — OXYCODONE HCL 5 MG/5ML PO SOLN
5.0000 mg | Freq: Once | ORAL | Status: AC | PRN
Start: 2020-04-22 — End: 2020-04-22

## 2020-04-22 MED ORDER — SODIUM CHLORIDE 0.9 % IR SOLN
Status: DC | PRN
  Administered 2020-04-22: 500 mL

## 2020-04-22 MED ORDER — METHOCARBAMOL 500 MG PO TABS: 500.0000 mg | ORAL_TABLET | Freq: Three times a day (TID) | ORAL | 0 refills | Status: AC | PRN

## 2020-04-22 MED ORDER — POVIDONE-IODINE 10 % EX SWAB: 2.0000 "application " | Freq: Once | CUTANEOUS | Status: DC

## 2020-04-22 MED ORDER — CHLORHEXIDINE GLUCONATE 4 % EX LIQD: 60.0000 mL | Freq: Once | CUTANEOUS | Status: DC

## 2020-04-22 MED ORDER — BUPIVACAINE HCL (PF) 0.5 % IJ SOLN
INTRAMUSCULAR | Status: DC | PRN
  Administered 2020-04-22: 15 mL

## 2020-04-22 MED ORDER — ONDANSETRON HCL 4 MG/2ML IJ SOLN
INTRAMUSCULAR | Status: DC | PRN
  Administered 2020-04-22: 4 mg via INTRAVENOUS

## 2020-04-22 MED ORDER — ASPIRIN EC 81 MG PO TBEC: 81.0000 mg | DELAYED_RELEASE_TABLET | Freq: Two times a day (BID) | ORAL | 0 refills | Status: AC

## 2020-04-22 MED ORDER — OXYCODONE HCL 5 MG PO TABS
ORAL_TABLET | ORAL | Status: AC
  Filled 2020-04-22: qty 1

## 2020-04-22 MED ORDER — OXYCODONE HCL 5 MG PO TABS: 5.0000 mg | ORAL_TABLET | Freq: Four times a day (QID) | ORAL | 0 refills | Status: AC | PRN

## 2020-04-22 MED ORDER — LACTATED RINGERS IV SOLN
INTRAVENOUS | Status: DC
  Administered 2020-04-22: 1000 mL via INTRAVENOUS

## 2020-04-22 MED ORDER — MELOXICAM 15 MG PO TABS: 15.0000 mg | ORAL_TABLET | Freq: Every day | ORAL | 0 refills | Status: AC

## 2020-04-22 MED ORDER — PROPOFOL 10 MG/ML IV BOLUS
INTRAVENOUS | Status: AC
  Filled 2020-04-22: qty 20

## 2020-04-22 MED ORDER — ACETAMINOPHEN 10 MG/ML IV SOLN: 1000.0000 mg | Freq: Once | INTRAVENOUS | Status: DC | PRN

## 2020-04-22 MED ORDER — MIDAZOLAM HCL 2 MG/2ML IJ SOLN
2.0000 mg | Freq: Once | INTRAMUSCULAR | Status: AC
  Administered 2020-04-22: 2 mg via INTRAVENOUS

## 2020-04-22 MED ORDER — ACETAMINOPHEN 500 MG PO TABS
1000.0000 mg | ORAL_TABLET | Freq: Once | ORAL | Status: AC
  Administered 2020-04-22: 1000 mg via ORAL

## 2020-04-22 MED ORDER — DEXAMETHASONE SODIUM PHOSPHATE 10 MG/ML IJ SOLN
INTRAMUSCULAR | Status: AC
  Filled 2020-04-22: qty 1

## 2020-04-22 MED ORDER — PROMETHAZINE HCL 25 MG/ML IJ SOLN: 6.2500 mg | INTRAMUSCULAR | Status: DC | PRN

## 2020-04-22 MED ORDER — CEFAZOLIN SODIUM-DEXTROSE 2-3 GM-%(50ML) IV SOLR
INTRAVENOUS | Status: DC | PRN
  Administered 2020-04-22: 2 g via INTRAVENOUS

## 2020-04-22 SURGICAL SUPPLY — 80 items
APL PRP STRL LF DISP 70% ISPRP (MISCELLANEOUS) ×1
BLADE CLIPPER SENSICLIP SURGIC (BLADE) IMPLANT
BLADE SURG 15 STRL LF DISP TIS (BLADE) ×1 IMPLANT
BLADE SURG 15 STRL SS (BLADE) ×2
BNDG CMPR 9X4 STRL LF SNTH (GAUZE/BANDAGES/DRESSINGS) ×1
BNDG COHESIVE 4X5 TAN STRL (GAUZE/BANDAGES/DRESSINGS) IMPLANT
BNDG ELASTIC 4X5.8 VLCR STR LF (GAUZE/BANDAGES/DRESSINGS) ×2 IMPLANT
BNDG ESMARK 4X9 LF (GAUZE/BANDAGES/DRESSINGS) ×2 IMPLANT
CHLORAPREP W/TINT 26 (MISCELLANEOUS) ×2 IMPLANT
CLSR STERI-STRIP ANTIMIC 1/2X4 (GAUZE/BANDAGES/DRESSINGS) ×2 IMPLANT
CORD BIPOLAR FORCEPS 12FT (ELECTRODE) ×2 IMPLANT
COVER BACK TABLE 60X90IN (DRAPES) IMPLANT
COVER WAND RF STERILE (DRAPES) ×2 IMPLANT
CUFF TOURN SGL QUICK 18X4 (TOURNIQUET CUFF) IMPLANT
CUFF TOURN SGL QUICK 24 (TOURNIQUET CUFF)
CUFF TRNQT CYL 24X4X16.5-23 (TOURNIQUET CUFF) IMPLANT
DRAPE C-ARM 35X43 STRL (DRAPES) ×2 IMPLANT
DRAPE C-ARM 42X120 X-RAY (DRAPES) ×2 IMPLANT
DRAPE EXTREMITY T 121X128X90 (DISPOSABLE) ×2 IMPLANT
DRAPE IMP U-DRAPE 54X76 (DRAPES) ×2 IMPLANT
DRAPE INCISE IOBAN 66X45 STRL (DRAPES) ×2 IMPLANT
DRAPE ORTHO SPLIT 77X108 STRL (DRAPES) ×4
DRAPE SURG ORHT 6 SPLT 77X108 (DRAPES) ×2 IMPLANT
DRAPE U-SHAPE 47X51 STRL (DRAPES) ×2 IMPLANT
DRILL 2.6X122MM WL AO SHAFT (BIT) ×2 IMPLANT
DRSG EMULSION OIL 3X3 NADH (GAUZE/BANDAGES/DRESSINGS) IMPLANT
DRSG MEPILEX BORDER 4X8 (GAUZE/BANDAGES/DRESSINGS) ×2 IMPLANT
ELECT REM PT RETURN 9FT ADLT (ELECTROSURGICAL) ×2
ELECTRODE REM PT RTRN 9FT ADLT (ELECTROSURGICAL) ×1 IMPLANT
GAUZE SPONGE 4X4 12PLY STRL (GAUZE/BANDAGES/DRESSINGS) ×2 IMPLANT
GAUZE XEROFORM 1X8 LF (GAUZE/BANDAGES/DRESSINGS) IMPLANT
GLOVE SRG 8 PF TXTR STRL LF DI (GLOVE) ×2 IMPLANT
GLOVE SURG ENC MOIS LTX SZ7.5 (GLOVE) ×8 IMPLANT
GLOVE SURG UNDER POLY LF SZ8 (GLOVE) ×4
GOWN STRL REUS W/TWL XL LVL3 (GOWN DISPOSABLE) ×4 IMPLANT
KIT TURNOVER CYSTO (KITS) ×2 IMPLANT
NEEDLE HYPO 25X1 1.5 SAFETY (NEEDLE) IMPLANT
NS IRRIG 1000ML POUR BTL (IV SOLUTION) ×2 IMPLANT
PACK ARTHROSCOPY DSU (CUSTOM PROCEDURE TRAY) ×2 IMPLANT
PACK BASIN DAY SURGERY FS (CUSTOM PROCEDURE TRAY) ×2 IMPLANT
PAD CAST 4YDX4 CTTN HI CHSV (CAST SUPPLIES) ×1 IMPLANT
PADDING CAST ABS 4INX4YD NS (CAST SUPPLIES) ×1
PADDING CAST ABS COTTON 4X4 ST (CAST SUPPLIES) ×1 IMPLANT
PADDING CAST COTTON 4X4 STRL (CAST SUPPLIES) ×2
PASSER SUT SWANSON 36MM LOOP (INSTRUMENTS) IMPLANT
PENCIL SMOKE EVACUATOR (MISCELLANEOUS) ×2 IMPLANT
PLATE BROAD COMPRESSION LEFT (Plate) ×2 IMPLANT
SCREW BONE 26MMX3.5MM (Screw) ×2 IMPLANT
SCREW NONLOCK 22MM (Screw) ×6 IMPLANT
SCREW NONLOCK 24MM (Screw) ×8 IMPLANT
SLING ARM FOAM STRAP LRG (SOFTGOODS) IMPLANT
SLING ARM FOAM STRAP MED (SOFTGOODS) IMPLANT
SLING ARM FOAM STRAP XLG (SOFTGOODS) IMPLANT
SLING ARM IMMOBILIZER LRG (SOFTGOODS) IMPLANT
SLING ARM IMMOBILIZER MED (SOFTGOODS) IMPLANT
SPLINT FAST PLASTER 5X30 (CAST SUPPLIES) ×10
SPLINT PLASTER CAST FAST 5X30 (CAST SUPPLIES) ×10 IMPLANT
SPONGE LAP 18X18 RF (DISPOSABLE) IMPLANT
STAPLER VISISTAT 35W (STAPLE) IMPLANT
SUCTION FRAZIER HANDLE 10FR (MISCELLANEOUS)
SUCTION TUBE FRAZIER 10FR DISP (MISCELLANEOUS) IMPLANT
SUT ETHILON 3 0 PS 1 (SUTURE) ×2 IMPLANT
SUT FIBERWIRE #2 38 T-5 BLUE (SUTURE)
SUT MNCRL AB 3-0 PS2 27 (SUTURE) ×2 IMPLANT
SUT MNCRL AB 4-0 PS2 18 (SUTURE) IMPLANT
SUT PROLENE 3 0 PS 2 (SUTURE) IMPLANT
SUT VIC AB 0 CT1 27 (SUTURE) ×2
SUT VIC AB 0 CT1 27XBRD ANBCTR (SUTURE) ×1 IMPLANT
SUT VIC AB 0 SH 27 (SUTURE) IMPLANT
SUT VIC AB 2-0 SH 27 (SUTURE) ×2
SUT VIC AB 2-0 SH 27XBRD (SUTURE) ×1 IMPLANT
SUT VIC AB 3-0 SH 27 (SUTURE)
SUT VIC AB 3-0 SH 27X BRD (SUTURE) IMPLANT
SUTURE FIBERWR #2 38 T-5 BLUE (SUTURE) IMPLANT
SYR BULB EAR ULCER 3OZ GRN STR (SYRINGE) ×2 IMPLANT
SYR CONTROL 10ML LL (SYRINGE) IMPLANT
TOWEL OR 17X26 10 PK STRL BLUE (TOWEL DISPOSABLE) ×2 IMPLANT
TUBE CONNECTING 12X1/4 (SUCTIONS) IMPLANT
UNDERPAD 30X36 HEAVY ABSORB (UNDERPADS AND DIAPERS) ×2 IMPLANT
YANKAUER SUCT BULB TIP NO VENT (SUCTIONS) ×2 IMPLANT

## 2020-04-22 NOTE — Anesthesia Postprocedure Evaluation (Signed)
Anesthesia Post Note  Patient: Jim Simmons  Procedure(s) Performed: OPEN REDUCTION INTERNAL FIXATION (ORIF) HUMERAL SHAFT FRACTURE (Left )     Patient location during evaluation: PACU Anesthesia Type: Regional and General Level of consciousness: awake and alert Pain management: pain level controlled Vital Signs Assessment: post-procedure vital signs reviewed and stable Respiratory status: spontaneous breathing, nonlabored ventilation, respiratory function stable and patient connected to nasal cannula oxygen Cardiovascular status: blood pressure returned to baseline and stable Postop Assessment: no apparent nausea or vomiting Anesthetic complications: no   No complications documented.  Last Vitals:  Vitals:   04/22/20 1245 04/22/20 1340  BP: 120/63 130/77  Pulse: 74 86  Resp: 17 12  Temp:  36.6 C  SpO2: 95% 93%    Last Pain:  Vitals:   04/22/20 1305  TempSrc:   PainSc: 4                  Jadin Creque P Naly Schwanz

## 2020-04-22 NOTE — Interval H&P Note (Signed)
History and Physical Interval Note:  04/22/2020 9:35 AM  Jim Simmons  has presented today for surgery, with the diagnosis of LEFT HUMERAL SHAFT FRACTURE.  The various methods of treatment have been discussed with the patient and family. After consideration of risks, benefits and other options for treatment, the patient has consented to  Procedure(s): OPEN REDUCTION INTERNAL FIXATION (ORIF) HUMERAL SHAFT FRACTURE (Left) as a surgical intervention.  The patient's history has been reviewed, patient examined, no change in status, stable for surgery.  I have reviewed the patient's chart and labs.  Questions were answered to the patient's satisfaction.     Renette Butters

## 2020-04-22 NOTE — Op Note (Signed)
04/22/2020  2:09 PM  PATIENT:  Jim Simmons    PRE-OPERATIVE DIAGNOSIS:  LEFT HUMERAL SHAFT FRACTURE  POST-OPERATIVE DIAGNOSIS:  Same  PROCEDURE:  OPEN REDUCTION INTERNAL FIXATION (ORIF) HUMERAL SHAFT FRACTURE  SURGEON:  Renette Butters, MD  ASSISTANT: Aggie Moats, PA-C, he was present and scrubbed throughout the case, critical for completion in a timely fashion, and for retraction, instrumentation, and closure.   ANESTHESIA:   gen  PREOPERATIVE INDICATIONS:  Jim Simmons is a  40 y.o. male with a diagnosis of LEFT HUMERAL SHAFT FRACTURE who failed conservative measures and elected for surgical management.    The risks benefits and alternatives were discussed with the patient preoperatively including but not limited to the risks of infection, bleeding, nerve injury, cardiopulmonary complications, the need for revision surgery, among others, and the patient was willing to proceed.  OPERATIVE IMPLANTS: stryker plate  OPERATIVE FINDINGS: unstable fracture  BLOOD LOSS: 53GU  COMPLICATIONS: none  TOURNIQUET TIME: none  OPERATIVE PROCEDURE:  Patient was identified in the preoperative holding area and site was marked by me He was transported to the operating theater and placed on the table in supine position taking care to pad all bony prominences. After a preincinduction time out anesthesia was induced. The left upper extremity was prepped and draped in normal sterile fashion and a pre-incision timeout was performed. He received ancef for preoperative antibiotics.   I made a volar approach of [name] to his humeral shaft fracture after localizing fluoroscopy.  Hemostasis was maintained and neurovascular structures were protected  Identified his fracture site clean this is interposed interposed soft tissue.  There was no nerve entrapment.  I was able to reduce this with a gentle manual reduction  Next I selected an 8 hole plate and placed this on the anterior aspect of the humerus 4  screws were placed above and 4 below the fracture all with excellent purchase.  I then took 4 x-rays of the humerus confirming appropriate reduction and hardware placement  I thoroughly irrigated and closed the skin in layers sterile dressing was applied and he was placed in a sling  POST OPERATIVE PLAN: sling full time, mobilize for dvt px

## 2020-04-22 NOTE — Discharge Instructions (Signed)
POST-OPERATIVE OPIOID TAPER INSTRUCTIONS: . It is important to wean off of your opioid medication as soon as possible. If you do not need pain medication after your surgery it is ok to stop day one. Marland Kitchen Opioids include: o Codeine, Hydrocodone(Norco, Vicodin), Oxycodone(Percocet, oxycontin) and hydromorphone amongst others.  . Long term and even short term use of opiods can cause: o Increased pain response o Dependence o Constipation o Depression o Respiratory depression o And more.  . Withdrawal symptoms can include o Flu like symptoms o Nausea, vomiting o And more . Techniques to manage these symptoms o Hydrate well o Eat regular healthy meals o Stay active o Use relaxation techniques(deep breathing, meditating, yoga) . Do Not substitute Alcohol to help with tapering . If you have been on opioids for less than two weeks and do not have pain than it is ok to stop all together.  . Plan to wean off of opioids o This plan should start within one week post op of your joint replacement. o Maintain the same interval or time between taking each dose and first decrease the dose.  o Cut the total daily intake of opioids by one tablet each day o Next start to increase the time between doses. o The last dose that should be eliminated is the evening dose.   Post Anesthesia Home Care Instructions  Activity: Get plenty of rest for the remainder of the day. A responsible individual must stay with you for 24 hours following the procedure.  For the next 24 hours, DO NOT: -Drive a car -Paediatric nurse -Drink alcoholic beverages -Take any medication unless instructed by your physician -Make any legal decisions or sign important papers.  Meals: Start with liquid foods such as gelatin or soup. Progress to regular foods as tolerated. Avoid greasy, spicy, heavy foods. If nausea and/or vomiting occur, drink only clear liquids until the nausea and/or vomiting subsides. Call your physician if vomiting  continues.  Special Instructions/Symptoms: Your throat may feel dry or sore from the anesthesia or the breathing tube placed in your throat during surgery. If this causes discomfort, gargle with warm salt water. The discomfort should disappear within 24 hours.  Regional Anesthesia Blocks  1. Numbness or the inability to move the "blocked" extremity may last from 3-48 hours after placement. The length of time depends on the medication injected and your individual response to the medication. If the numbness is not going away after 48 hours, call your surgeon.  2. The extremity that is blocked will need to be protected until the numbness is gone and the  Strength has returned. Because you cannot feel it, you will need to take extra care to avoid injury. Because it may be weak, you may have difficulty moving it or using it. You may not know what position it is in without looking at it while the block is in effect.  3. For blocks in the legs and feet, returning to weight bearing and walking needs to be done carefully. You will need to wait until the numbness is entirely gone and the strength has returned. You should be able to move your leg and foot normally before you try and bear weight or walk. You will need someone to be with you when you first try to ensure you do not fall and possibly risk injury.  4. Bruising and tenderness at the needle site are common side effects and will resolve in a few days.  5. Persistent numbness or new problems with movement  should be communicated to the surgeon or the [place] [phone] [place] [phone]).  Information for Discharge Teaching: EXPAREL (bupivacaine liposome injectable suspension)   Your surgeon or anesthesiologist gave you EXPAREL(bupivacaine) to help control your pain after surgery.   EXPAREL is a local anesthetic that provides pain relief by numbing the tissue around the surgical site.  EXPAREL is designed  to release pain medication over time and can control pain for up to 72 hours.  Depending on how you respond to EXPAREL, you may require less pain medication during your recovery.  Possible side effects:  Temporary loss of sensation or ability to move in the area where bupivacaine was injected.  Nausea, vomiting, constipation  Rarely, numbness and tingling in your mouth or lips, lightheadedness, or anxiety may occur.  Call your doctor right away if you think you may be experiencing any of these sensations, or if you have other questions regarding possible side effects.  Follow all other discharge instructions given to you by your surgeon or nurse. Eat a healthy diet and drink plenty of water or other fluids.  If you return to the hospital for any reason within 96 hours following the administration of EXPAREL, it is important for health care providers to know that you have received this anesthetic. A teal colored band has been placed on your arm with the date, time and amount of EXPAREL you have received in order to alert and inform your health care providers. Please leave this armband in place for the full 96 hours following administration, and then you may remove the band.

## 2020-04-22 NOTE — Anesthesia Preprocedure Evaluation (Addendum)
Anesthesia Evaluation  Patient identified by MRN, date of birth, ID band Patient awake    Reviewed: Allergy & Precautions, NPO status , Patient's Chart, lab work & pertinent test results  Airway Mallampati: II  TM Distance: >3 FB Neck ROM: Full    Dental  (+) Teeth Intact   Pulmonary neg pulmonary ROS,    Pulmonary exam normal        Cardiovascular negative cardio ROS   Rhythm:Regular Rate:Normal     Neuro/Psych negative neurological ROS  negative psych ROS   GI/Hepatic Neg liver ROS, GERD  ,  Endo/Other  negative endocrine ROS  Renal/GU negative Renal ROS Bladder dysfunction  Urinary retention, self catherizes     Musculoskeletal Left humeral shaft fracture    Abdominal (+)  Abdomen: soft. Bowel sounds: normal.  Peds  Hematology negative hematology ROS (+)   Anesthesia Other Findings   Reproductive/Obstetrics                             Anesthesia Physical Anesthesia Plan  ASA: II  Anesthesia Plan: General and Regional   Post-op Pain Management:  Regional for Post-op pain   Induction: Intravenous  PONV Risk Score and Plan: 2 and Ondansetron, Dexamethasone, Midazolam and Treatment may vary due to age or medical condition  Airway Management Planned: Mask and LMA  Additional Equipment: None  Intra-op Plan:   Post-operative Plan: Extubation in OR  Informed Consent: I have reviewed the patients History and Physical, chart, labs and discussed the procedure including the risks, benefits and alternatives for the proposed anesthesia with the patient or authorized representative who has indicated his/her understanding and acceptance.     Dental advisory given  Plan Discussed with: CRNA  Anesthesia Plan Comments:        Anesthesia Quick Evaluation

## 2020-04-22 NOTE — Anesthesia Procedure Notes (Signed)
Anesthesia Regional Block: Interscalene brachial plexus block   Pre-Anesthetic Checklist: ,, timeout performed, Correct Patient, Correct Site, Correct Laterality, Correct Procedure, Correct Position, site marked, Risks and benefits discussed,  Surgical consent,  Pre-op evaluation,  At surgeon's request and post-op pain management  Laterality: Left  Prep: Dura Prep       Needles:  Injection technique: Single-shot  Needle Type: Echogenic Stimulator Needle     Needle Length: 5cm  Needle Gauge: 20     Additional Needles:   Procedures:,,,, ultrasound used (permanent image in chart),,,,  Narrative:  Start time: 04/22/2020 9:33 AM End time: 04/22/2020 9:37 AM Injection made incrementally with aspirations every 5 mL.  Performed by: Personally  Anesthesiologist: Darral Dash, DO  Additional Notes: Patient identified. Risks/Benefits/Options discussed with patient including but not limited to bleeding, infection, nerve damage, failed block, incomplete pain control. Patient expressed understanding and wished to proceed. All questions were answered. Sterile technique was used throughout the entire procedure. Please see nursing notes for vital signs. Aspirated in 5cc intervals with injection for negative confirmation. Patient was given instructions on fall risk and not to get out of bed. All questions and concerns addressed with instructions to call with any issues or inadequate analgesia.

## 2020-04-22 NOTE — Anesthesia Procedure Notes (Signed)
Procedure Name: LMA Insertion Date/Time: 04/22/2020 10:15 AM Performed by: Bonney Aid, CRNA Pre-anesthesia Checklist: Patient identified, Emergency Drugs available, Suction available and Patient being monitored Patient Re-evaluated:Patient Re-evaluated prior to induction Oxygen Delivery Method: Circle system utilized Preoxygenation: Pre-oxygenation with 100% oxygen Induction Type: IV induction Ventilation: Mask ventilation without difficulty LMA: LMA inserted LMA Size: 5.0 Number of attempts: 1 Airway Equipment and Method: Bite block Placement Confirmation: positive ETCO2 Tube secured with: Tape Dental Injury: Teeth and Oropharynx as per pre-operative assessment

## 2020-04-22 NOTE — Transfer of Care (Signed)
Immediate Anesthesia Transfer of Care Note  Patient: Jim Simmons  Procedure(s) Performed: OPEN REDUCTION INTERNAL FIXATION (ORIF) HUMERAL SHAFT FRACTURE (Left )  Patient Location: PACU  Anesthesia Type:General  Level of Consciousness: drowsy  Airway & Oxygen Therapy: Patient Spontanous Breathing and Patient connected to nasal cannula oxygen  Post-op Assessment: Report given to RN  Post vital signs: Reviewed and stable  Last Vitals:  Vitals Value Taken Time  BP 134/90 04/22/20 1148  Temp 36.4 C 04/22/20 1148  Pulse 83 04/22/20 1154  Resp 16 04/22/20 1154  SpO2 97 % 04/22/20 1154  Vitals shown include unvalidated device data.  Last Pain:  Vitals:   04/22/20 0858  TempSrc: Oral  PainSc: 2       Patients Stated Pain Goal: 3 (43/88/87 5797)  Complications: No complications documented.

## 2020-04-22 NOTE — Progress Notes (Signed)
Assisted Dr. Jana Half with left, ultrasound guided, interscalene  block. Side rails up, monitors on throughout procedure. See vital signs in flow sheet. Tolerated Procedure well.

## 2020-04-23 ENCOUNTER — Encounter (HOSPITAL_BASED_OUTPATIENT_CLINIC_OR_DEPARTMENT_OTHER): Payer: Self-pay | Admitting: Orthopedic Surgery

## 2022-08-20 ENCOUNTER — Other Ambulatory Visit: Payer: Self-pay | Admitting: Neurosurgery

## 2022-08-20 DIAGNOSIS — D334 Benign neoplasm of spinal cord: Secondary | ICD-10-CM

## 2022-08-31 ENCOUNTER — Other Ambulatory Visit: Payer: Self-pay | Admitting: Neurosurgery

## 2022-08-31 DIAGNOSIS — W3400XA Accidental discharge from unspecified firearms or gun, initial encounter: Secondary | ICD-10-CM

## 2022-08-31 DIAGNOSIS — S00259A Superficial foreign body of unspecified eyelid and periocular area, initial encounter: Secondary | ICD-10-CM

## 2022-10-06 ENCOUNTER — Encounter: Payer: Self-pay | Admitting: Neurosurgery

## 2022-10-12 ENCOUNTER — Ambulatory Visit
Admission: RE | Admit: 2022-10-12 | Discharge: 2022-10-12 | Disposition: A | Discharge status: Home / Self Care | Payer: BC Managed Care – PPO | Source: Ambulatory Visit | Attending: Neurosurgery | Admitting: Neurosurgery

## 2022-10-12 ENCOUNTER — Inpatient Hospital Stay: Admission: RE | Admit: 2022-10-12 | Payer: BC Managed Care – PPO | Source: Ambulatory Visit

## 2022-10-12 DIAGNOSIS — W3400XA Accidental discharge from unspecified firearms or gun, initial encounter: Secondary | ICD-10-CM

## 2022-10-12 DIAGNOSIS — S00259A Superficial foreign body of unspecified eyelid and periocular area, initial encounter: Secondary | ICD-10-CM

## 2022-10-12 DIAGNOSIS — D334 Benign neoplasm of spinal cord: Secondary | ICD-10-CM

## 2022-10-12 MED ORDER — GADOPICLENOL 0.5 MMOL/ML IV SOLN
10.0000 mL | Freq: Once | INTRAVENOUS | Status: AC | PRN
  Administered 2022-10-12: 10 mL via INTRAVENOUS
